# Patient Record
Sex: Female | Born: 1960
Health system: Southern US, Community
[De-identification: ages and names within clinical notes are randomized; demographics above are authoritative.]

## PROBLEM LIST (undated history)

## (undated) DIAGNOSIS — Z780 Asymptomatic menopausal state: Secondary | ICD-10-CM

## (undated) DIAGNOSIS — N83209 Unspecified ovarian cyst, unspecified side: Secondary | ICD-10-CM

## (undated) DIAGNOSIS — L920 Granuloma annulare: Secondary | ICD-10-CM

## (undated) DIAGNOSIS — S52501A Unspecified fracture of the lower end of right radius, initial encounter for closed fracture: Secondary | ICD-10-CM

## (undated) DIAGNOSIS — R0989 Other specified symptoms and signs involving the circulatory and respiratory systems: Secondary | ICD-10-CM

## (undated) DIAGNOSIS — H9190 Unspecified hearing loss, unspecified ear: Secondary | ICD-10-CM

## (undated) DIAGNOSIS — M549 Dorsalgia, unspecified: Secondary | ICD-10-CM

## (undated) DIAGNOSIS — F32A Depression, unspecified: Secondary | ICD-10-CM

## (undated) DIAGNOSIS — H04129 Dry eye syndrome of unspecified lacrimal gland: Secondary | ICD-10-CM

## (undated) HISTORY — DX: Dry eye syndrome of unspecified lacrimal gland: H04.129

## (undated) HISTORY — DX: Granuloma annulare: L92.0

## (undated) HISTORY — PX: APPENDECTOMY: SHX54

## (undated) HISTORY — DX: Other specified symptoms and signs involving the circulatory and respiratory systems: R09.89

## (undated) HISTORY — DX: Dorsalgia, unspecified: M54.9

## (undated) HISTORY — PX: ENDOMETRIAL ABLATION: SHX621

## (undated) HISTORY — DX: Unspecified fracture of the lower end of right radius, initial encounter for closed fracture: S52.501A

## (undated) HISTORY — DX: Unspecified hearing loss, unspecified ear: H91.90

## (undated) HISTORY — DX: Unspecified ovarian cyst, unspecified side: N83.209

## (undated) HISTORY — PX: BREAST BIOPSY: SHX20

## (undated) HISTORY — DX: Depression, unspecified: F32.A

## (undated) HISTORY — DX: Asymptomatic menopausal state: Z78.0

## (undated) HISTORY — PX: TONSILLECTOMY: SUR1361

---

## 1984-05-05 HISTORY — PX: OTHER SURGICAL HISTORY: SHX169

## 1998-10-05 ENCOUNTER — Ambulatory Visit (HOSPITAL_COMMUNITY): Admission: RE | Admit: 1998-10-05 | Discharge: 1998-10-05 | Payer: Self-pay | Admitting: *Deleted

## 2000-06-06 ENCOUNTER — Emergency Department (HOSPITAL_COMMUNITY): Admission: EM | Admit: 2000-06-06 | Discharge: 2000-06-06 | Payer: Self-pay | Admitting: Emergency Medicine

## 2000-06-06 ENCOUNTER — Encounter: Payer: Self-pay | Admitting: Emergency Medicine

## 2000-06-09 ENCOUNTER — Emergency Department (HOSPITAL_COMMUNITY): Admission: EM | Admit: 2000-06-09 | Discharge: 2000-06-09 | Payer: Self-pay | Admitting: Emergency Medicine

## 2000-07-27 ENCOUNTER — Other Ambulatory Visit: Admission: RE | Admit: 2000-07-27 | Discharge: 2000-07-27 | Payer: Self-pay | Admitting: Obstetrics and Gynecology

## 2001-09-03 ENCOUNTER — Other Ambulatory Visit: Admission: RE | Admit: 2001-09-03 | Discharge: 2001-09-03 | Payer: Self-pay | Admitting: Obstetrics and Gynecology

## 2002-10-07 ENCOUNTER — Encounter: Admission: RE | Admit: 2002-10-07 | Discharge: 2002-10-07 | Payer: Self-pay | Admitting: Internal Medicine

## 2002-10-07 ENCOUNTER — Encounter: Payer: Self-pay | Admitting: Internal Medicine

## 2002-11-21 ENCOUNTER — Other Ambulatory Visit: Admission: RE | Admit: 2002-11-21 | Discharge: 2002-11-21 | Payer: Self-pay | Admitting: Obstetrics and Gynecology

## 2003-12-05 ENCOUNTER — Other Ambulatory Visit: Admission: RE | Admit: 2003-12-05 | Discharge: 2003-12-05 | Payer: Self-pay | Admitting: Obstetrics and Gynecology

## 2004-03-07 ENCOUNTER — Ambulatory Visit: Payer: Self-pay | Admitting: Family Medicine

## 2004-03-07 ENCOUNTER — Encounter: Admission: RE | Admit: 2004-03-07 | Discharge: 2004-03-07 | Payer: Self-pay | Admitting: Family Medicine

## 2004-08-21 ENCOUNTER — Ambulatory Visit: Payer: Self-pay | Admitting: Family Medicine

## 2004-08-22 ENCOUNTER — Ambulatory Visit: Payer: Self-pay | Admitting: Internal Medicine

## 2004-08-28 ENCOUNTER — Ambulatory Visit: Payer: Self-pay | Admitting: Internal Medicine

## 2005-02-25 ENCOUNTER — Other Ambulatory Visit: Admission: RE | Admit: 2005-02-25 | Discharge: 2005-02-25 | Payer: Self-pay | Admitting: Obstetrics and Gynecology

## 2005-10-10 ENCOUNTER — Ambulatory Visit: Payer: Self-pay | Admitting: Internal Medicine

## 2005-11-24 ENCOUNTER — Ambulatory Visit: Payer: Self-pay | Admitting: Internal Medicine

## 2006-10-20 ENCOUNTER — Ambulatory Visit: Payer: Self-pay | Admitting: Family Medicine

## 2006-10-20 DIAGNOSIS — R1032 Left lower quadrant pain: Secondary | ICD-10-CM | POA: Insufficient documentation

## 2006-10-20 DIAGNOSIS — H9190 Unspecified hearing loss, unspecified ear: Secondary | ICD-10-CM | POA: Insufficient documentation

## 2006-10-21 ENCOUNTER — Ambulatory Visit (HOSPITAL_COMMUNITY): Admission: RE | Admit: 2006-10-21 | Discharge: 2006-10-21 | Payer: Self-pay | Admitting: Family Medicine

## 2006-10-22 ENCOUNTER — Telehealth: Payer: Self-pay | Admitting: Family Medicine

## 2006-10-28 ENCOUNTER — Ambulatory Visit: Payer: Self-pay | Admitting: Internal Medicine

## 2009-01-29 ENCOUNTER — Ambulatory Visit: Payer: Self-pay | Admitting: Internal Medicine

## 2009-05-05 HISTORY — PX: UPPER GASTROINTESTINAL ENDOSCOPY: SHX188

## 2009-09-25 ENCOUNTER — Ambulatory Visit: Payer: Self-pay | Admitting: Internal Medicine

## 2009-09-26 ENCOUNTER — Ambulatory Visit: Payer: Self-pay | Admitting: Internal Medicine

## 2009-09-26 ENCOUNTER — Encounter (INDEPENDENT_AMBULATORY_CARE_PROVIDER_SITE_OTHER): Payer: Self-pay | Admitting: *Deleted

## 2009-09-28 LAB — CONVERTED CEMR LAB
ALT: 20 units/L (ref 0–35)
AST: 20 units/L (ref 0–37)
BUN: 14 mg/dL (ref 6–23)
Basophils Absolute: 0 10*3/uL (ref 0.0–0.1)
Basophils Relative: 0.8 % (ref 0.0–3.0)
CO2: 27 meq/L (ref 19–32)
Calcium: 9.2 mg/dL (ref 8.4–10.5)
Chloride: 103 meq/L (ref 96–112)
Cholesterol: 243 mg/dL — ABNORMAL HIGH (ref 0–200)
Creatinine, Ser: 0.7 mg/dL (ref 0.4–1.2)
Direct LDL: 122.8 mg/dL
Eosinophils Absolute: 0.1 10*3/uL (ref 0.0–0.7)
Eosinophils Relative: 1.2 % (ref 0.0–5.0)
GFR calc non Af Amer: 100.21 mL/min (ref >60)
Glucose, Bld: 88 mg/dL (ref 70–99)
HCT: 40.7 % (ref 36.0–46.0)
HDL: 91.6 mg/dL (ref >39.00)
Hemoglobin: 14.1 g/dL (ref 12.0–15.0)
Lymphocytes Relative: 32.4 % (ref 12.0–46.0)
Lymphs Abs: 1.9 10*3/uL (ref 0.7–4.0)
MCHC: 34.6 g/dL (ref 30.0–36.0)
MCV: 99.7 fL (ref 78.0–100.0)
Monocytes Absolute: 0.4 10*3/uL (ref 0.1–1.0)
Monocytes Relative: 7 % (ref 3.0–12.0)
Neutro Abs: 3.4 10*3/uL (ref 1.4–7.7)
Neutrophils Relative %: 58.6 % (ref 43.0–77.0)
Platelets: 202 10*3/uL (ref 150.0–400.0)
Potassium: 4.5 meq/L (ref 3.5–5.1)
RBC: 4.09 M/uL (ref 3.87–5.11)
RDW: 12.8 % (ref 11.5–14.6)
Sodium: 140 meq/L (ref 135–145)
TSH: 2.87 microintl units/mL (ref 0.35–5.50)
Total CHOL/HDL Ratio: 3
Triglycerides: 166 mg/dL — ABNORMAL HIGH (ref 0.0–149.0)
VLDL: 33.2 mg/dL (ref 0.0–40.0)
WBC: 5.9 10*3/uL (ref 4.5–10.5)

## 2009-10-05 ENCOUNTER — Encounter (INDEPENDENT_AMBULATORY_CARE_PROVIDER_SITE_OTHER): Payer: Self-pay

## 2009-10-09 ENCOUNTER — Ambulatory Visit: Payer: Self-pay | Admitting: Gastroenterology

## 2009-10-22 ENCOUNTER — Ambulatory Visit: Payer: Self-pay | Admitting: Gastroenterology

## 2009-10-22 LAB — HM COLONOSCOPY

## 2009-12-19 ENCOUNTER — Telehealth (INDEPENDENT_AMBULATORY_CARE_PROVIDER_SITE_OTHER): Payer: Self-pay | Admitting: *Deleted

## 2009-12-19 ENCOUNTER — Ambulatory Visit: Payer: Self-pay | Admitting: Internal Medicine

## 2009-12-19 LAB — CONVERTED CEMR LAB
Bilirubin Urine: NEGATIVE
Glucose, Urine, Semiquant: NEGATIVE
Ketones, urine, test strip: NEGATIVE
Nitrite: NEGATIVE
Protein, U semiquant: NEGATIVE
Specific Gravity, Urine: 1.015
Urobilinogen, UA: 0.2
WBC Urine, dipstick: NEGATIVE
pH: 7

## 2009-12-20 ENCOUNTER — Encounter: Payer: Self-pay | Admitting: Internal Medicine

## 2009-12-20 LAB — CONVERTED CEMR LAB
Bacteria, UA: NONE SEEN
Casts: NONE SEEN /lpf
Crystals: NONE SEEN

## 2009-12-26 ENCOUNTER — Telehealth (INDEPENDENT_AMBULATORY_CARE_PROVIDER_SITE_OTHER): Payer: Self-pay | Admitting: *Deleted

## 2010-02-01 ENCOUNTER — Ambulatory Visit: Payer: Self-pay | Admitting: Internal Medicine

## 2010-02-01 ENCOUNTER — Ambulatory Visit: Payer: Self-pay | Admitting: Cardiology

## 2010-02-01 DIAGNOSIS — R319 Hematuria, unspecified: Secondary | ICD-10-CM | POA: Insufficient documentation

## 2010-02-01 LAB — CONVERTED CEMR LAB
ALT: 17 units/L (ref 0–35)
AST: 18 units/L (ref 0–37)
Albumin: 4 g/dL (ref 3.5–5.2)
Alkaline Phosphatase: 32 units/L — ABNORMAL LOW (ref 39–117)
Amylase: 100 units/L (ref 27–131)
BUN: 14 mg/dL (ref 6–23)
Basophils Absolute: 0 10*3/uL (ref 0.0–0.1)
Basophils Relative: 0.6 % (ref 0.0–3.0)
Bilirubin Urine: NEGATIVE
Bilirubin, Direct: 0.1 mg/dL (ref 0.0–0.3)
CO2: 28 meq/L (ref 19–32)
Calcium: 9 mg/dL (ref 8.4–10.5)
Chloride: 101 meq/L (ref 96–112)
Creatinine, Ser: 0.8 mg/dL (ref 0.4–1.2)
Eosinophils Absolute: 0.1 10*3/uL (ref 0.0–0.7)
Eosinophils Relative: 0.9 % (ref 0.0–5.0)
GFR calc non Af Amer: 80.15 mL/min (ref >60)
Glucose, Bld: 87 mg/dL (ref 70–99)
Glucose, Urine, Semiquant: NEGATIVE
HCT: 41 % (ref 36.0–46.0)
Hemoglobin: 14.1 g/dL (ref 12.0–15.0)
Ketones, urine, test strip: NEGATIVE
Lipase: 31 units/L (ref 11.0–59.0)
Lymphocytes Relative: 35 % (ref 12.0–46.0)
Lymphs Abs: 2.2 10*3/uL (ref 0.7–4.0)
MCHC: 34.3 g/dL (ref 30.0–36.0)
MCV: 100.5 fL — ABNORMAL HIGH (ref 78.0–100.0)
Monocytes Absolute: 0.5 10*3/uL (ref 0.1–1.0)
Monocytes Relative: 7.5 % (ref 3.0–12.0)
Neutro Abs: 3.5 10*3/uL (ref 1.4–7.7)
Neutrophils Relative %: 56 % (ref 43.0–77.0)
Nitrite: NEGATIVE
Platelets: 213 10*3/uL (ref 150.0–400.0)
Potassium: 5.2 meq/L — ABNORMAL HIGH (ref 3.5–5.1)
Protein, U semiquant: NEGATIVE
RBC: 4.08 M/uL (ref 3.87–5.11)
RDW: 12.9 % (ref 11.5–14.6)
Sodium: 136 meq/L (ref 135–145)
Specific Gravity, Urine: 1.015
Total Bilirubin: 0.3 mg/dL (ref 0.3–1.2)
Total Protein: 6.7 g/dL (ref 6.0–8.3)
Urobilinogen, UA: 0.2
WBC Urine, dipstick: NEGATIVE
WBC: 6.3 10*3/uL (ref 4.5–10.5)
pH: 6

## 2010-02-02 ENCOUNTER — Encounter: Payer: Self-pay | Admitting: Internal Medicine

## 2010-02-03 LAB — CONVERTED CEMR LAB
Casts: NONE SEEN /lpf
Crystals: NONE SEEN

## 2010-02-04 ENCOUNTER — Encounter: Payer: Self-pay | Admitting: Gastroenterology

## 2010-02-04 ENCOUNTER — Telehealth: Payer: Self-pay | Admitting: Internal Medicine

## 2010-02-22 ENCOUNTER — Telehealth: Payer: Self-pay | Admitting: Internal Medicine

## 2010-02-22 ENCOUNTER — Telehealth: Payer: Self-pay | Admitting: Gastroenterology

## 2010-02-25 ENCOUNTER — Ambulatory Visit: Payer: Self-pay | Admitting: Gastroenterology

## 2010-02-25 ENCOUNTER — Encounter: Payer: Self-pay | Admitting: Gastroenterology

## 2010-02-25 DIAGNOSIS — R0989 Other specified symptoms and signs involving the circulatory and respiratory systems: Secondary | ICD-10-CM

## 2010-02-25 DIAGNOSIS — R1084 Generalized abdominal pain: Secondary | ICD-10-CM | POA: Insufficient documentation

## 2010-02-25 HISTORY — DX: Other specified symptoms and signs involving the circulatory and respiratory systems: R09.89

## 2010-03-01 ENCOUNTER — Ambulatory Visit: Payer: Self-pay | Admitting: Gastroenterology

## 2010-04-12 ENCOUNTER — Ambulatory Visit: Payer: Self-pay | Admitting: Gastroenterology

## 2010-06-02 LAB — CONVERTED CEMR LAB
ALT: 13 units/L (ref 0–40)
AST: 16 units/L (ref 0–37)
Albumin: 4.2 g/dL (ref 3.5–5.2)
Alkaline Phosphatase: 31 units/L — ABNORMAL LOW (ref 39–117)
BUN: 8 mg/dL (ref 6–23)
Basophils Absolute: 0 10*3/uL (ref 0.0–0.1)
Basophils Relative: 0.3 % (ref 0.0–1.0)
Bilirubin Urine: NEGATIVE
Bilirubin, Direct: 0.1 mg/dL (ref 0.0–0.3)
CO2: 31 meq/L (ref 19–32)
Calcium: 9.1 mg/dL (ref 8.4–10.5)
Chloride: 103 meq/L (ref 96–112)
Creatinine, Ser: 0.7 mg/dL (ref 0.4–1.2)
Eosinophils Absolute: 0.1 10*3/uL (ref 0.0–0.6)
Eosinophils Relative: 0.9 % (ref 0.0–5.0)
GFR calc Af Amer: 115 mL/min
GFR calc non Af Amer: 95 mL/min
Glucose, Bld: 88 mg/dL (ref 70–99)
Glucose, Urine, Semiquant: NEGATIVE
HCT: 40.6 % (ref 36.0–46.0)
Hemoglobin: 14.1 g/dL (ref 12.0–15.0)
Ketones, urine, test strip: NEGATIVE
Lymphocytes Relative: 32.8 % (ref 12.0–46.0)
MCHC: 34.7 g/dL (ref 30.0–36.0)
MCV: 96.6 fL (ref 78.0–100.0)
Monocytes Absolute: 0.5 10*3/uL (ref 0.2–0.7)
Monocytes Relative: 8.3 % (ref 3.0–11.0)
Neutro Abs: 3.6 10*3/uL (ref 1.4–7.7)
Neutrophils Relative %: 57.7 % (ref 43.0–77.0)
Nitrite: NEGATIVE
Platelets: 201 10*3/uL (ref 150–400)
Potassium: 3.6 meq/L (ref 3.5–5.1)
Protein, U semiquant: NEGATIVE
RBC: 4.2 M/uL (ref 3.87–5.11)
RDW: 12.2 % (ref 11.5–14.6)
Sodium: 139 meq/L (ref 135–145)
Specific Gravity, Urine: 1.015
TSH: 2.27 microintl units/mL (ref 0.35–5.50)
Total Bilirubin: 0.6 mg/dL (ref 0.3–1.2)
Total Protein: 7.1 g/dL (ref 6.0–8.3)
Urobilinogen, UA: NEGATIVE
WBC Urine, dipstick: NEGATIVE
WBC: 6.2 10*3/uL (ref 4.5–10.5)
pH: 6

## 2010-06-04 NOTE — Procedures (Signed)
Summary: Upper Endoscopy  Patient: [name] Note: All result statuses are Final unless otherwise noted.  Tests: (1) Upper Endoscopy (EGD)   EGD Upper Endoscopy       DONE     [facility]     [address]     [place], [place], [place]  [address]           ENDOSCOPY PROCEDURE REPORT           PATIENT:  Pamela, Hansen  MR#:  [id number]     BIRTHDATE:  Oct 15, 1960, 49 yrs. old  GENDER:  female     ENDOSCOPIST:  Rachael Fee, MD     PROCEDURE DATE:  03/01/2010     PROCEDURE:  EGD with biopsy, 43239     ASA CLASS:  Class II     INDICATIONS:  post prandial left sided abdominal pain; normal     cmet, cbc; Ct scan suggested Celiac Axis stenosis, post stenotic     dilation     MEDICATIONS:  Fentanyl 50 mcg IV, Versed 3 mg IV     TOPICAL ANESTHETIC:  Exactacain Spray           DESCRIPTION OF PROCEDURE:   After the risks benefits and     alternatives of the procedure were thoroughly explained, informed     consent was obtained.  The LB GIF-H180 [id number] endoscope was     introduced through the mouth and advanced to the second portion of     the duodenum, without limitations.  The instrument was slowly     withdrawn as the mucosa was fully examined.     <<PROCEDUREIMAGES>>     There was mild to moderate, non-specific gastritis. Most prominant     in distal stomach. Biopsies taken to check for H. pylori (see     image5).  Otherwise the examination was normal (see image6,     image4, image3, and image1).    Retroflexed views revealed no     abnormalities.    The scope was then withdrawn from the patient     and the procedure completed.     COMPLICATIONS:  None           ENDOSCOPIC IMPRESSION:     1) Mild to moderate non-specific gastritis, biopsied to check     for H. pylori     2) Otherwise normal examination           RECOMMENDATIONS:     Await final biopsies.  If H. pylori is detected, she will be     started on the appropriate antibiotics.     In the meantime, please  start once daily OTC prilosec (or     generic equivalent). This is best taken 20-30 min prior to BF     meal.           ______________________________     Rachael Fee, MD           n.     eSIGNED:   Rachael Fee at 03/01/2010 10:31 AM           Mcclay, Clydie Braun, [id number]  Note: An exclamation mark (!) indicates a result that was not dispersed into the flowsheet. Document Creation Date: 03/01/2010 10:32 AM _______________________________________________________________________  (1) Order result status: Final Collection or observation date-time: 03/01/2010 10:24 Requested date-time:  Receipt date-time:  Reported date-time:  Referring Physician:   Ordering Physician: Rob Bunting [phone]) Specimen Source:  Source: Launa Grill Order Number:  [id number] Lab site:

## 2010-06-04 NOTE — Progress Notes (Signed)
Summary: FYI on ABX  Phone Note Call from Patient Call back at Work Phone [phone]   Caller: Patient Summary of Call: Patient called and said that she is on her Cipro that Dr. Drue Novel rx'd her, but she got a call from her GYN office saying that she had a bacterial infection and they were calling in something for her.   I called and spoke with the pharmacist @ [facility] and the [facility] office called in Flagyl which is compatible to take with the Cipro.  Initial call taken by: Harold Barban,  December 19, 2009 11:48 AM  Follow-up for Phone Call        Spoke with patient and informed her she could take both because they treat different things.  Follow-up by: Harold Barban,  December 19, 2009 11:48 AM  Additional Follow-up for Phone Call Additional follow up Details #1::        okay to take both for now, we'll see how the urine culture come back and how she's feeling in  few days Additional Follow-up by: [facility] E. Paz MD,  December 19, 2009 4:55 PM

## 2010-06-04 NOTE — Letter (Signed)
Summary: EGD Instructions  [facility]  [address] [place], [place] [address]   Phone: [phone]  Fax: [phone]       Pamela Hansen    Jun 28, 1960    MRN: [id number]       Procedure Day /Date: 03-01-10     Arrival Time: 9:00 AM      Procedure Time:10:00 AM     Location of Procedure:                    X     [facility] (4th Floor) PREPARATION FOR ENDOSCOPY   On 03-01-10 THE DAY OF THE PROCEDURE:  1.   No solid foods, milk or milk products are allowed after midnight the night before your procedure.  2.   Do not drink anything colored red or purple.  Avoid juices with pulp.  No orange juice.  3.  You may drink clear liquids until 8:00 Am , which is 2 hours before your procedure.                                                                                                CLEAR LIQUIDS INCLUDE: Water Jello Ice Popsicles Tea (sugar ok, no milk/cream) Powdered fruit flavored drinks Coffee (sugar ok, no milk/cream) Gatorade Juice: apple, white grape, white cranberry  Lemonade Clear bullion, consomm, broth Carbonated beverages (any kind) Strained chicken noodle soup Hard Candy   MEDICATION INSTRUCTIONS  Unless otherwise instructed, you should take regular prescription medications with a small sip of water as early as possible the morning of your procedure.          OTHER INSTRUCTIONS  You will need a responsible adult at least 52 years of age to accompany you and drive you home.   This person must remain in the waiting room during your procedure.  Wear loose fitting clothing that is easily removed.  Leave jewelry and other valuables at home.  However, you may wish to bring a book to read or an iPod/MP3 player to listen to music as you wait for your procedure to start.  Remove all body piercing jewelry and leave at home.  Total time from sign-in until discharge is approximately 2-3 hours.  You should go home directly after  your procedure and rest.  You can resume normal activities the day after your procedure.  The day of your procedure you should not:   Drive   Make legal decisions   Operate machinery   Drink alcohol   Return to work  You will receive specific instructions about eating, activities and medications before you leave.    The above instructions have been reviewed and explained to me by   _______________________    I fully understand and can verbalize these instructions _____________________________ Date _________
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
## 2010-06-04 NOTE — Procedures (Signed)
Summary: Colonoscopy  Patient: Pamela Hansen Note: All result statuses are Final unless otherwise noted.  Tests: (1) Colonoscopy (COL)   COL Colonoscopy           DONE     [facility]     [address]     [place], [place]  [address]           COLONOSCOPY PROCEDURE REPORT           PATIENT:  [name], [name]  MR#:  [id number]     BIRTHDATE:  1961/01/26, 49 yrs. old  GENDER:  female     ENDOSCOPIST:  Rachael Fee, MD     REF. BY:  Willow Ora, M.D.     PROCEDURE DATE:  10/22/2009     PROCEDURE:  Diagnostic Colonoscopy     ASA CLASS:  Class II     INDICATIONS:  Routine Risk Screening     MEDICATIONS:   Fentanyl 100 mcg IV, Versed 10 mg IV           DESCRIPTION OF PROCEDURE:   After the risks benefits and     alternatives of the procedure were thoroughly explained, informed     consent was obtained.  Digital rectal exam was performed and     revealed no rectal masses.   The LB PCF-H180AL [id number] endoscope     was introduced through the anus and advanced to the cecum, which     was identified by both the appendix and ileocecal valve, without     limitations.  The quality of the prep was good, using MoviPrep.     The instrument was then slowly withdrawn as the colon was fully     examined.     <<PROCEDUREIMAGES>>           FINDINGS:  A normal appearing cecum, ileocecal valve, and     appendiceal orifice were identified. The ascending, hepatic     flexure, transverse, splenic flexure, descending, sigmoid colon,     and rectum appeared unremarkable (see image1, image2, and image3).     Internal and external hemorrhoids were found. These were small and     not thrombosed.   Retroflexed views in the rectum revealed no     abnormalities.    The scope was then withdrawn from the patient     and the procedure completed.           COMPLICATIONS:  None           ENDOSCOPIC IMPRESSION:     1) Normal colon     2) Internal and external hemorrhoids     3) No polyps or  cancers           RECOMMENDATIONS:     1) Continue to follow current colorectal screening     recommendations for "routine risk" patients with a repeat     colonoscopy in 10 years.           REPEAT EXAM:  10 years           ______________________________     Rachael Fee, MD           n.     eSIGNED:   Rachael Fee at 10/22/2009 11:21 AM           Minta Balsam, Clydie Braun, [id number]  Note: An exclamation mark (!) indicates a result that was not dispersed into the flowsheet. Document Creation Date: 10/22/2009 11:22 AM _______________________________________________________________________  (1) Order  result status: Final Collection or observation date-time: 10/22/2009 11:17 Requested date-time:  Receipt date-time:  Reported date-time:  Referring Physician:   Ordering Physician: Rob Bunting [phone]) Specimen Source:  Source: Launa Grill Order Number: [id number] Lab site:   Appended Document: Colonoscopy    Clinical Lists Changes  Observations: Added new observation of COLONNXTDUE: 10/2019 (10/22/2009 14:09)

## 2010-06-04 NOTE — Letter (Signed)
Summary: Primary Care Consult Scheduled Letter  [facility]  [address] [place], [place] [address]   Phone: [phone]  Fax: [phone]      09/26/2009 MRN: [id number]  [facility] [address] [place], [place]  [address]    Dear Ms. Tomei,    We have scheduled an appointment for you.  At the recommendation of Dr. Willow Ora, we have scheduled you for a Screening Colonoscopy with Dr. Rob Bunting of [facility].  Your initial Pre-Visit with a Nurse is on 10-09-2009 at 10:30am.  Your Screening Colonoscopy is on 10-22-2009 arrive by 10:00am.  Their address is [address], [place] [address]. The office phone number is [phone].  If this appointment day and time is not convenient for you, please feel free to call the office of the doctor you are being referred to at the number listed above and reschedule the appointment.    It is important for you to keep your scheduled appointments. We are here to make sure you are given good patient care.   Thank you,    Renee, Patient Care Coordinator [facility]
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
## 2010-06-04 NOTE — Assessment & Plan Note (Signed)
Summary: warm feeling all over,pain left side,cbs   Vital Signs:  Patient Profile:   50 Years Old Female Weight:      136.4 pounds Temp:     98.3 degrees F oral Pulse rate:   72 / minute BP sitting:   102 / 64  (left arm)  Vitals Entered By: Shonna Chock (October 20, 2006 11:45 AM)               PCP:  lowne  Chief Complaint:  PAIN LEFT LOWER ABDOMINAL AREA X 2WEEKS AND A WARM FEELING ALL OVER.Marland Kitchen  History of Present Illness: Pt c/o few week hx of LLQ abd pain on and off.  Pt also c/o palpatations last night.  Lasted 15 min then gone.  No chest pain , no sob.   No n/V/D no fever.  No otc meds.  Current Allergies: ! CLARITIN (LORATADINE TABS) ! ZYRTEC (CETIRIZINE HCL TABS)  Past Surgical History:    Appendectomy    Tonsillectomy     Review of Systems      See HPI  General      Denies chills, fatigue, fever, loss of appetite, malaise, sleep disorder, sweats, weakness, and weight loss.  ENT      Denies decreased hearing, difficulty swallowing, ear discharge, earache, hoarseness, nasal congestion, nosebleeds, postnasal drainage, ringing in ears, sinus pressure, and sore throat.  CV      Complains of palpitations.      Denies chest pain or discomfort, difficulty breathing at night, difficulty breathing while lying down, lightheadness, and near fainting.  Resp      Denies chest discomfort, chest pain with inspiration, cough, coughing up blood, excessive snoring, hypersomnolence, morning headaches, pleuritic, shortness of breath, sputum productive, and wheezing.  GI      Complains of abdominal pain and excessive appetite.      Denies bloody stools, change in bowel habits, constipation, dark tarry stools, diarrhea, gas, indigestion, loss of appetite, nausea, and vomiting.  GU      Denies abnormal vaginal bleeding, decreased libido, discharge, dysuria, genital sores, hematuria, incontinence, nocturia, urinary frequency, and urinary hesitancy.   Physical Exam  General:     Well-developed,well-nourished,in no acute distress; alert,appropriate and cooperative throughout examination Eyes:     vision grossly intact, pupils round, and pupils reactive to light.   Lungs:     Normal respiratory effort, chest expands symmetrically. Lungs are clear to auscultation, no crackles or wheezes. Heart:     normal rate, regular rhythm, and no murmur.   Abdomen:     minimal am. of tenderness LLQsoft, no distention, no guarding, no rigidity, and no rebound tenderness.   Rectal:     No external abnormalities noted. Normal sphincter tone. No rectal masses or tenderness.external hemorrhoid(s).  Heme negative brown stool Genitalia:     Normal introitus for age, no external lesions, no vaginal discharge, mucosa pink and moist, no vaginal or cervical lesions, no vaginal atrophy, no friaility or hemorrhage, normal uterus size and position, no adnexal masses or tenderness Neurologic:     alert & oriented X3 and cranial nerves II-XII intact. Pt is hard of hearing--- Hx of      Impression & Recommendations:  Problem # 1:  SYMPTOM, PAIN, ABDOMINAL, LEFT LOWER QUADRANT (ICD-789.04)  Orders: Venipuncture (42595) TLB-BMP (Basic Metabolic Panel-BMET) (80048-METABOL) TLB-Hepatic/Liver Function Pnl (80076-HEPATIC) TLB-CBC Platelet - w/Differential (85025-CBCD) TLB-TSH (Thyroid Stimulating Hormone) (63875-IEP) Radiology Referral (Radiology) [place] abd levsin SL  nexium 40 mg once daily   Orders: Venipuncture (  54098) TLB-BMP (Basic Metabolic Panel-BMET) (80048-METABOL) TLB-Hepatic/Liver Function Pnl (80076-HEPATIC) TLB-CBC Platelet - w/Differential (85025-CBCD) TLB-TSH (Thyroid Stimulating Hormone) (11914-NWG) Radiology Referral (Radiology)   Problem # 2:  PALPITATIONS (ICD-785.1) RTO if they occur again--- consider Event monitor vs echo Orders: Venipuncture (95621) TLB-BMP (Basic Metabolic Panel-BMET) (80048-METABOL) TLB-Hepatic/Liver Function Pnl (80076-HEPATIC) TLB-CBC  Platelet - w/Differential (85025-CBCD) TLB-TSH (Thyroid Stimulating Hormone) (84443-TSH) EKG w/ Interpretation (93000)   Medications Added to Medication List This Visit: 1)  Flonase 50 Mcg/act Susp (Fluticasone propionate) .... As needed 2)  Levsin/sl 0.125 Mg Subl (Hyoscyamine sulfate) .Marland Kitchen.. 1 by mouth under tongue every 4 hours as needed abd. cramping   Laboratory Results   Urine Tests   Date/Time Reported: October 20, 2006 12:06 PM   Routine Urinalysis   Color: lt. yellow Appearance: Clear Glucose: negative   (Normal Range: Negative) Bilirubin: negative   (Normal Range: Negative) Ketone: negative   (Normal Range: Negative) Spec. Gravity: 1.015   (Normal Range: 1.003-1.035) Blood: small   (Normal Range: Negative) pH: 6.0   (Normal Range: 5.0-8.0) Protein: negative   (Normal Range: Negative) Urobilinogen: negative   (Normal Range: 0-1) Nitrite: negative   (Normal Range: Negative) Leukocyte Esterace: negative   (Normal Range: Negative)

## 2010-06-04 NOTE — Progress Notes (Signed)
Summary: results/referral  Phone Note Call from Patient Call back at Home Phone [phone]   Summary of Call: Patient calling for test results, and would like to know about specialist. Please advise.   Please leave detailed message if she does not answer. Initial call taken by: Lucious Groves CMA,  February 04, 2010 11:35 AM  Follow-up for Phone Call        left message, told patient we are refering  her to GI for evaluation of abdominal pain and abnormal CT Follow-up by: [facility] E. Trystyn Dolley MD,  February 05, 2010 12:50 PM

## 2010-06-04 NOTE — Progress Notes (Signed)
Summary: LOWNE,Y-TEST RESULTS  Phone Note Call from Patient Call back at [phone]   Caller: Patient Reason for Call: Lab or Test Results Summary of Call: PLEASE CALL RESULTS IN TO HER AT WORK NUMBER LISTED ABOVE  Initial call taken by: Gwen Pounds,  October 22, 2006 12:57 PM  Follow-up for Phone Call        Informed pt of results says pain still comes and goes depending on what she is doing.  I also let her know that a CT may be ordered so she is waiting for further recommendations.  Pt is going ahead with  CT referral Follow-up by: Doristine Devoid,  October 22, 2006 2:55 PM  Additional Follow-up for Phone Call Additional follow up Details #1::        ordered Additional Follow-up by: Loreen Freud DO,  October 22, 2006 5:38 PM

## 2010-06-04 NOTE — Progress Notes (Signed)
Summary: stomach pain  Phone Note Call from Patient Call back at Work Phone [phone]   Caller: Patient Summary of Call: Pt called Dr.Jacobs office and they transferred her to our office. She states the pain in her stomach is getting worse. She states they are worse after she eats. Pt would like to know if there is something she should be doing or is it okay for her to wait until November 15th to see Dr.Jacobs.  Please advise.  Initial call taken by: Army Fossa CMA,  February 22, 2010 1:25 PM  Follow-up for Phone Call        please call GI, see if they could see her sooner [facility] E. Jesilyn Easom MD  February 22, 2010 1:33 PM   Additional Follow-up for Phone Call Additional follow up Details #1::        Spoke with Dr. Christella Hartigan office they are going to discuss with Dr.Jacobs who will be back in the office and Monday. They will notify the pt of new appt. Pt is aware. Additional Follow-up by: Army Fossa CMA,  February 22, 2010 1:48 PM

## 2010-06-04 NOTE — Assessment & Plan Note (Signed)
  Review of gastrointestinal problems: 1. Routine risk for colon cancer, colonoscopy 2011 found hemorrhoids but no polyps or cancers. Next colonoscopy at 10 year interval 2. abdominal discomfort, EGD October 2011 found mild to moderate gastritis, biopsies were negative for H. pylori.  Trial of proton pump inhibitors   .  CAT Scan September 2011 suggested abnormal celiac trunk with post stenotic dilation   History of Present Illness Visit Type: Follow-up Visit Primary GI MD: Rob Bunting MD Primary Provider: Willow Ora, MD Chief Complaint: 4-5 week f/u History of Present Illness:     very pleasant 50 year old woman whom I last saw about a month a half ago.  she took 14 days of prilosec, pains went away.  Stools also became a bit more solid during that time.  She drinks about 2 glasses of wine a day.  Also stoppe this at time of starting prilosec. Still feels better.  She thinks some spicey foods may contribute.             Current Medications (verified): 1)  Restasis 0.05 % Emul (Cyclosporine) 2)  Fish Oil 1000 Mg Caps (Omega-3 Fatty Acids) .... One Capsule By Mouth Once Daily  Allergies (verified): 1)  ! Claritin 2)  ! Zyrtec 3)  ! * Shellfish  Vital Signs:  Patient profile:   50 year old female Height:      62 inches Weight:      137 pounds BMI:     25.15 Pulse rate:   80 / minute Pulse rhythm:   regular BP sitting:   112 / 52  (left arm)  Vitals Entered By: Chales Abrahams CMA Duncan Dull) (April 12, 2010 10:55 AM)  Physical Exam  Additional Exam:  Constitutional: generally well appearing Psychiatric: alert and oriented times 3 Abdomen: soft, non-tender, non-distended, normal bowel sounds    Impression & Recommendations:  Problem # 1:  Gastritis, dyspepsia I think her symptoms are GERD, acid related. She will restart proton pump inhibitor once daily for the next month and then back down to every other day. She will return to see me on an as-needed basis. The celiac  axis narrowing noted by CT scan is probably not clinically significant however she does note to be where of significant, postprandial pains.  Patient Instructions: 1)  Restart omeprazole (prilosec), one pill a day (20-30 min before BF meal).  In about one month, cut back to every other day. 2)  Ok to have 1-2 glassed of wine a day. 3)  Return to Dr. Christella Hartigan as needed, especially if you have signficant pains after meals. 4)  A copy of this information will be sent to  Dr. Drue Novel. 5)  The medication list was reviewed and reconciled.  All changed / newly prescribed medications were explained.  A complete medication list was provided to the patient / caregiver.

## 2010-06-04 NOTE — Assessment & Plan Note (Signed)
Summary: FRACTURE ?/KDC   Vital Signs:  Patient profile:   50 year old female Height:      62 inches Weight:      139.4 pounds BMI:     25.59 Pulse rate:   70 / minute Pulse rhythm:   regular BP sitting:   90 / 60  (left arm) Cuff size:   regular  Vitals Entered By: Shary Decamp (January 29, 2009 8:31 AM) CC: acute only -- walk in Is Patient Diabetic? No Comments   - injured rt chin tubing Labor day weekend (hit it on a rock)   - + pain with walking, c/o of ankle swelling & bruising .....Marland KitchenMarland KitchenShary Decamp  January 29, 2009 8:34 AM    History of Present Illness:  acute only -- walk in hit R pretibial area 3 weeks ago, area was swollen, tender, "blue and red" swelling better still hurts a lot when walks  c/o of ankle swelling & bruising after walking   Current Medications (verified): 1)  None  Allergies (verified): 1)  ! Claritin 2)  ! Zyrtec  Past History:  Past Medical History: deafness  h/o dry eyes (on Restassis) h/o ovarian cysts  h/o granuloma annulary  Social History: married  2 daughters  tobacco--no ETOH-- wine socially   Review of Systems       no fever no calf pain or swelling   Physical Exam  General:  alert and well-developed.   Pulses:  normal pedal pulses bilaterally  Extremities:  L leg-ankle normal R leg: mod pretibial area w/some swelling, + tenderness to touch, no deformities-warmness R ankle: normal    Impression & Recommendations:  Problem # 1:  CONTUSION, LOWER LEG, RIGHT (ICD-924.10)  motrin and ice as needed call if no better in 3-4 weeks XR to asses tib-fib  Orders: T-Tib/Fib Right (73590TC)

## 2010-06-04 NOTE — Progress Notes (Signed)
Summary: checking on patient   Phone Note Outgoing Call   Summary of Call: advise patient: Urine culture was negative, she can discontinue Cipro. let me know if she still has symptoms Jose E. Paz MD  December 26, 2009 8:35 AM     Follow-up for Phone Call        Left message for pt to call back. Army Fossa CMA  December 26, 2009 10:16 AM  Patient called back and LM to call her back, lmtcb.Marland KitchenMarland KitchenHarold Barban  December 26, 2009 1:08 PM  Additional Follow-up for Phone Call Additional follow up Details #1::        Left message to call back. Army Fossa CMA  December 27, 2009 12:39 PM  spoke w/ patient aware of information .........Marland KitchenDoristine Devoid CMA  December 27, 2009 4:57 PM

## 2010-06-04 NOTE — Letter (Signed)
Summary: [facility] Instructions  [facility]  [address] [place], [place] [address]   Phone: [phone]  Fax: [phone]       Pamela Hansen    23-Mar-1975    MRN: [id number]        Procedure Day Dorna Bloom:  Duanne Limerick  10/22/09     Arrival Time:  10:00AM     Procedure Time:  11:00AM     Location of Procedure:                    _X _  [facility] (4th Floor)                        PREPARATION FOR COLONOSCOPY WITH MOVIPREP   Starting 5 days prior to your procedure 10/17/09 do not eat nuts, seeds, popcorn, corn, beans, peas,  salads, or any raw vegetables.  Do not take any fiber supplements (e.g. Metamucil, Citrucel, and Benefiber).  THE DAY BEFORE YOUR PROCEDURE         DATE: 10/21/09  DAY: SUNDAY  1.  Drink clear liquids the entire day-NO SOLID FOOD  2.  Do not drink anything colored red or purple.  Avoid juices with pulp.  No orange juice.  3.  Drink at least 64 oz. (8 glasses) of fluid/clear liquids during the day to prevent dehydration and help the prep work efficiently.  CLEAR LIQUIDS INCLUDE: Water Jello Ice Popsicles Tea (sugar ok, no milk/cream) Powdered fruit flavored drinks Coffee (sugar ok, no milk/cream) Gatorade Juice: apple, white grape, white cranberry  Lemonade Clear bullion, consomm, broth Carbonated beverages (any kind) Strained chicken noodle soup Hard Candy                             4.  In the morning, mix first dose of MoviPrep solution:    Empty 1 Pouch A and 1 Pouch B into the disposable container    Add lukewarm drinking water to the top line of the container. Mix to dissolve    Refrigerate (mixed solution should be used within 24 hrs)  5.  Begin drinking the prep at 5:00 p.m. The MoviPrep container is divided by 4 marks.   Every 15 minutes drink the solution down to the next mark (approximately 8 oz) until the full liter is complete.   6.  Follow completed prep with 16 oz of clear liquid of your choice  (Nothing red or purple).  Continue to drink clear liquids until bedtime.  7.  Before going to bed, mix second dose of MoviPrep solution:    Empty 1 Pouch A and 1 Pouch B into the disposable container    Add lukewarm drinking water to the top line of the container. Mix to dissolve    Refrigerate  THE DAY OF YOUR PROCEDURE      DATE: 10/22/09  DAY: MONDAY  Beginning at 6:00AM (5 hours before procedure):         1. Every 15 minutes, drink the solution down to the next mark (approx 8 oz) until the full liter is complete.  2. Follow completed prep with 16 oz. of clear liquid of your choice.    3. You may drink clear liquids until 9:00AM (2 HOURS BEFORE PROCEDURE).   MEDICATION INSTRUCTIONS  Unless otherwise instructed, you should take regular prescription medications with a small sip of water   as early as possible the morning  of your procedure.         OTHER INSTRUCTIONS  You will need a responsible adult at least 52 years of age to accompany you and drive you home.   This person must remain in the waiting room during your procedure.  Wear loose fitting clothing that is easily removed.  Leave jewelry and other valuables at home.  However, you may wish to bring a book to read or  an iPod/MP3 player to listen to music as you wait for your procedure to start.  Remove all body piercing jewelry and leave at home.  Total time from sign-in until discharge is approximately 2-3 hours.  You should go home directly after your procedure and rest.  You can resume normal activities the  day after your procedure.  The day of your procedure you should not:   Drive   Make legal decisions   Operate machinery   Drink alcohol   Return to work  You will receive specific instructions about eating, activities and medications before you leave.    The above instructions have been reviewed and explained to me by   Ulis Rias RN  October 09, 2009 10:48 AM     I fully understand and  can verbalize these instructions _____________________________ Date _________
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
## 2010-06-04 NOTE — Letter (Signed)
Summary: New Patient letter  [facility]  [address] [place], [place] [address]   Phone: [phone]  Fax: [phone]       02/04/2010 MRN: [id number]  [facility] [address] [place], [place]  [address]  Dear Ms. Bouley,  Welcome to the Gastroenterology Division at [facility].    You are scheduled to see Dr.  Christella Hartigan on 03-19-10 at 10:30am on the 3rd floor at [facility], [address]  We ask that you try to arrive at our office 15 minutes prior to your appointment time to allow for check-in.  We would like you to complete the enclosed self-administered evaluation form prior to your visit and bring it with you on the day of your appointment.  We will review it with you.  Also, please bring a complete list of all your medications or, if you prefer, bring the medication bottles and we will list them.  Please bring your insurance card so that we may make a copy of it.  If your insurance requires a referral to see a specialist, please bring your referral form from your primary care physician.  Co-payments are due at the time of your visit and may be paid by cash, check or credit card.     Your office visit will consist of a consult with your physician (includes a physical exam), any laboratory testing he/she may order, scheduling of any necessary diagnostic testing (e.g. x-ray, ultrasound, CT-scan), and scheduling of a procedure (e.g. Endoscopy, Colonoscopy) if required.  Please allow enough time on your schedule to allow for any/all of these possibilities.    If you cannot keep your appointment, please call [phone] to cancel or reschedule prior to your appointment date.  This allows [place] the opportunity to schedule an appointment for another patient in need of care.  If you do not cancel or reschedule by 5 p.m. the business day prior to your appointment date, you will be charged a $50.00 late cancellation/no-show fee.    Thank you for choosing  [facility] for your medical needs.  We appreciate the opportunity to care for you.  Please visit [place] at our website  to learn more about our practice.                     Sincerely,                                                             The Gastroenterology Division

## 2010-06-04 NOTE — Miscellaneous (Signed)
Summary: Lec previsit  Clinical Lists Changes  Medications: Added new medication of MOVIPREP 100 GM  SOLR (PEG-KCL-NACL-NASULF-NA ASC-C) As per prep instructions. - Signed Rx of MOVIPREP 100 GM  SOLR (PEG-KCL-NACL-NASULF-NA ASC-C) As per prep instructions.;  #1 x 0;  Signed;  Entered by: Ulis Rias RN;  Authorized by: Rachael Fee MD;  Method used: Electronically to [id number] [address] [place], [place], [place]  [address], Ph: [phone], Fax: [phone] Observations: Added new observation of ALLERGY REV: Done (10/09/2009 10:12)    Prescriptions: MOVIPREP 100 GM  SOLR (PEG-KCL-NACL-NASULF-NA ASC-C) As per prep instructions.  #1 x 0   Entered by:   Ulis Rias RN   Authorized by:   Rachael Fee MD   Signed by:   Ulis Rias RN on 10/09/2009   Method used:   Electronically to        [id number] (retail)       [address]       [place], [place]  [address]       Ph: [phone]       Fax: [phone]   RxID:   [id number]

## 2010-06-04 NOTE — Progress Notes (Signed)
Summary: Appt sooner  Phone Note Call from Patient Call back at Work Phone [phone]   Call For: Dr Christella Hartigan Summary of Call: Abd Pain is getting worse & would like to be seen sooner than 03-19-10. Initial call taken by: Leanor Kail [facility],  February 22, 2010 1:47 PM  Follow-up for Phone Call        pt has continued constant left side abd pain, does not want to wait to see Dr Christella Hartigan until 11/15.  Appt scheduled with Gunnar Fusi for 02/25/10 Follow-up by: Chales Abrahams CMA Duncan Dull),  February 22, 2010 2:18 PM

## 2010-10-04 ENCOUNTER — Ambulatory Visit (INDEPENDENT_AMBULATORY_CARE_PROVIDER_SITE_OTHER): Payer: BC Managed Care – PPO | Admitting: Internal Medicine

## 2010-10-04 ENCOUNTER — Encounter: Payer: Self-pay | Admitting: Internal Medicine

## 2010-10-04 VITALS — BP 120/84 | HR 93 | Wt 136.8 lb

## 2010-10-04 DIAGNOSIS — M549 Dorsalgia, unspecified: Secondary | ICD-10-CM
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
## 2010-10-04 NOTE — Assessment & Plan Note (Addendum)
2 or 3 months and history of back pain, some left leg pain, absent left ankle DTR. Most likely the primary problem is in the back , ?radiculopathy. We talked about conservative treatment versus go to the see the orthopedic doctor. She elected the referral. We'll arrange. She also has knee pain, but will be assessed for that at a later time. Knee pain is in the daytime when walking, likely unrelated to the back pain. Declined pain medication

## 2010-10-16 ENCOUNTER — Other Ambulatory Visit: Payer: Self-pay | Admitting: Orthopedic Surgery

## 2010-10-16 DIAGNOSIS — M5410 Radiculopathy, site unspecified: Secondary | ICD-10-CM

## 2010-10-16 DIAGNOSIS — M545 Low back pain, unspecified: Secondary | ICD-10-CM

## 2010-10-17 ENCOUNTER — Ambulatory Visit
Admission: RE | Admit: 2010-10-17 | Discharge: 2010-10-17 | Disposition: A | Discharge status: Home / Self Care | Payer: BC Managed Care – PPO | Source: Ambulatory Visit | Attending: Orthopedic Surgery | Admitting: Orthopedic Surgery

## 2010-10-17 DIAGNOSIS — M545 Low back pain, unspecified: Secondary | ICD-10-CM

## 2010-10-17 DIAGNOSIS — M5410 Radiculopathy, site unspecified: Secondary | ICD-10-CM

## 2010-10-21 ENCOUNTER — Ambulatory Visit
Admission: RE | Admit: 2010-10-21 | Discharge: 2010-10-21 | Disposition: A | Discharge status: Home / Self Care | Payer: BC Managed Care – PPO | Source: Ambulatory Visit | Attending: Orthopedic Surgery | Admitting: Orthopedic Surgery

## 2010-10-21 ENCOUNTER — Other Ambulatory Visit: Payer: Self-pay | Admitting: Orthopedic Surgery

## 2010-10-21 DIAGNOSIS — G971 Other reaction to spinal and lumbar puncture: Secondary | ICD-10-CM

## 2011-02-13 ENCOUNTER — Ambulatory Visit: Payer: BC Managed Care – PPO | Attending: Physical Medicine and Rehabilitation | Admitting: Physical Therapy

## 2011-02-13 DIAGNOSIS — IMO0001 Reserved for inherently not codable concepts without codable children: Secondary | ICD-10-CM | POA: Insufficient documentation

## 2011-02-13 DIAGNOSIS — M545 Low back pain, unspecified: Secondary | ICD-10-CM | POA: Insufficient documentation

## 2011-02-17 ENCOUNTER — Ambulatory Visit: Payer: BC Managed Care – PPO | Admitting: Physical Therapy

## 2011-02-20 ENCOUNTER — Ambulatory Visit: Payer: BC Managed Care – PPO | Admitting: Physical Therapy

## 2011-02-24 ENCOUNTER — Ambulatory Visit: Payer: BC Managed Care – PPO | Admitting: Physical Therapy

## 2011-02-28 ENCOUNTER — Ambulatory Visit: Payer: BC Managed Care – PPO | Admitting: Physical Therapy

## 2011-03-03 ENCOUNTER — Ambulatory Visit: Payer: BC Managed Care – PPO | Admitting: Physical Therapy

## 2011-03-05 ENCOUNTER — Ambulatory Visit: Payer: BC Managed Care – PPO | Admitting: Physical Therapy

## 2011-03-10 ENCOUNTER — Ambulatory Visit: Payer: BC Managed Care – PPO | Attending: Physical Medicine and Rehabilitation | Admitting: Physical Therapy

## 2011-03-10 DIAGNOSIS — M545 Low back pain, unspecified: Secondary | ICD-10-CM | POA: Insufficient documentation

## 2011-03-10 DIAGNOSIS — IMO0001 Reserved for inherently not codable concepts without codable children: Secondary | ICD-10-CM | POA: Insufficient documentation

## 2011-03-14 ENCOUNTER — Ambulatory Visit: Payer: BC Managed Care – PPO | Admitting: Physical Therapy

## 2011-03-17 ENCOUNTER — Ambulatory Visit: Payer: BC Managed Care – PPO | Admitting: Physical Therapy

## 2011-03-20 ENCOUNTER — Ambulatory Visit: Payer: BC Managed Care – PPO | Admitting: Physical Therapy

## 2011-03-31 ENCOUNTER — Ambulatory Visit: Payer: BC Managed Care – PPO | Admitting: Physical Therapy

## 2011-10-21 ENCOUNTER — Telehealth: Payer: Self-pay | Admitting: Internal Medicine

## 2011-10-21 ENCOUNTER — Ambulatory Visit (INDEPENDENT_AMBULATORY_CARE_PROVIDER_SITE_OTHER): Payer: BC Managed Care – PPO | Admitting: Internal Medicine

## 2011-10-21 VITALS — BP 114/74 | HR 65 | Temp 98.4°F | Wt 139.0 lb

## 2011-10-21 DIAGNOSIS — T7840XA Allergy, unspecified, initial encounter: Secondary | ICD-10-CM

## 2011-10-21 MED ORDER — CEPHALEXIN 500 MG PO CAPS: 500.0000 mg | ORAL_CAPSULE | Freq: Four times a day (QID) | ORAL | Status: AC

## 2011-10-21 MED ORDER — PREDNISONE 10 MG PO TABS: ORAL_TABLET | ORAL | Status: DC

## 2011-10-21 NOTE — Patient Instructions (Addendum)
Take meds as prescribed Also benadryl OTC Call if no better in few days Call anytime if the symptoms are severe

## 2011-10-21 NOTE — Assessment & Plan Note (Signed)
Local allergic reaction to a bite, patient not sure if it was a mosquito or something else. She has redness, I wonder if there is some cellulitis as well. Plan: Prednisone, Benadryl, Keflex. See instructions
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
## 2011-10-21 NOTE — Telephone Encounter (Signed)
Caller: Noemy/Patient; PCP: Willow Ora; CB#: [phone];  Call regarding Insect Bite; R forearm near elbow.  Pt not present.  Spoke with unidentified adult female at residence.  Triage declined-requesting appt only per PCP Call, No Triage Guideline.   Appt scheduled for 1300 10/21/11 with Dr Drue Novel.

## 2011-10-21 NOTE — Telephone Encounter (Signed)
Noted  

## 2011-10-22 ENCOUNTER — Encounter: Payer: Self-pay | Admitting: Internal Medicine

## 2011-11-12 ENCOUNTER — Other Ambulatory Visit: Payer: Self-pay | Admitting: Obstetrics and Gynecology

## 2012-03-22 ENCOUNTER — Encounter: Payer: Self-pay | Admitting: Internal Medicine

## 2012-03-22 ENCOUNTER — Ambulatory Visit (INDEPENDENT_AMBULATORY_CARE_PROVIDER_SITE_OTHER): Payer: BC Managed Care – PPO | Admitting: Internal Medicine

## 2012-03-22 VITALS — BP 108/70 | HR 83 | Temp 98.3°F | Wt 139.0 lb

## 2012-03-22 DIAGNOSIS — L989 Disorder of the skin and subcutaneous tissue, unspecified: Secondary | ICD-10-CM
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
## 2012-03-22 NOTE — Assessment & Plan Note (Signed)
Ring like lesions, they don't seem to be fungal. History of granuloma annulare. Plan: Refer to dermatology

## 2012-03-22 NOTE — Patient Instructions (Addendum)
Will schedule a physical exam within a month

## 2012-04-13 ENCOUNTER — Ambulatory Visit (INDEPENDENT_AMBULATORY_CARE_PROVIDER_SITE_OTHER): Payer: BC Managed Care – PPO | Admitting: Internal Medicine

## 2012-04-13 ENCOUNTER — Encounter: Payer: Self-pay | Admitting: Internal Medicine

## 2012-04-13 VITALS — BP 122/74 | HR 70 | Temp 97.9°F | Ht 62.0 in | Wt 140.0 lb

## 2012-04-13 DIAGNOSIS — Z0000 Encounter for general adult medical examination without abnormal findings: Secondary | ICD-10-CM

## 2012-04-13 DIAGNOSIS — R0989 Other specified symptoms and signs involving the circulatory and respiratory systems: Secondary | ICD-10-CM

## 2012-04-13 LAB — CBC WITH DIFFERENTIAL/PLATELET
Basophils Absolute: 0 10*3/uL (ref 0.0–0.1)
Basophils Relative: 0.5 % (ref 0.0–3.0)
Eosinophils Absolute: 0 10*3/uL (ref 0.0–0.7)
Eosinophils Relative: 0.9 % (ref 0.0–5.0)
HCT: 42.2 % (ref 36.0–46.0)
Hemoglobin: 14.1 g/dL (ref 12.0–15.0)
Lymphocytes Relative: 34.7 % (ref 12.0–46.0)
Lymphs Abs: 1.8 10*3/uL (ref 0.7–4.0)
MCHC: 33.5 g/dL (ref 30.0–36.0)
MCV: 99.8 fl (ref 78.0–100.0)
Monocytes Absolute: 0.4 10*3/uL (ref 0.1–1.0)
Monocytes Relative: 7.8 % (ref 3.0–12.0)
Neutro Abs: 3 10*3/uL (ref 1.4–7.7)
Neutrophils Relative %: 56.1 % (ref 43.0–77.0)
Platelets: 186 10*3/uL (ref 150.0–400.0)
RBC: 4.23 Mil/uL (ref 3.87–5.11)
RDW: 12.6 % (ref 11.5–14.6)
WBC: 5.3 10*3/uL (ref 4.5–10.5)

## 2012-04-13 LAB — TSH: TSH: 1.43 u[IU]/mL (ref 0.35–5.50)

## 2012-04-13 LAB — COMPREHENSIVE METABOLIC PANEL
ALT: 23 U/L (ref 0–35)
AST: 19 U/L (ref 0–37)
Albumin: 4.2 g/dL (ref 3.5–5.2)
Alkaline Phosphatase: 42 U/L (ref 39–117)
BUN: 12 mg/dL (ref 6–23)
CO2: 28 mEq/L (ref 19–32)
Calcium: 9.1 mg/dL (ref 8.4–10.5)
Chloride: 102 mEq/L (ref 96–112)
Creatinine, Ser: 0.7 mg/dL (ref 0.4–1.2)
GFR: 91.21 mL/min (ref >60.00)
Glucose, Bld: 91 mg/dL (ref 70–99)
Potassium: 3.9 mEq/L (ref 3.5–5.1)
Sodium: 138 mEq/L (ref 135–145)
Total Bilirubin: 0.8 mg/dL (ref 0.3–1.2)
Total Protein: 7 g/dL (ref 6.0–8.3)

## 2012-04-13 LAB — LIPID PANEL
Cholesterol: 257 mg/dL — ABNORMAL HIGH (ref 0–200)
HDL: 88 mg/dL (ref >39.00)
Total CHOL/HDL Ratio: 3
Triglycerides: 61 mg/dL (ref 0.0–149.0)
VLDL: 12.2 mg/dL (ref 0.0–40.0)

## 2012-04-13 LAB — LDL CHOLESTEROL, DIRECT: Direct LDL: 158.2 mg/dL

## 2012-04-13 NOTE — Assessment & Plan Note (Signed)
Palpable , nontender aorta today with an abdominal bruit  CT of the abdomen 02-2010 showed: --Normal aorta --Abnormal appearance of the celiac access with narrowing just distal to the origin and post dilatation detailed above. Correlation with postprandial abdominal pain is recommended as this may be seen with medial articular ligament syndrome.  Status post GI eval 2011, had EGD. Currently no symptoms to suggest GI a vascular insufficiency Plan: Observation for now

## 2012-04-13 NOTE — Assessment & Plan Note (Addendum)
Td 07 Flu shot explained benefits, declined  Interested in zostavax, rec to check cost w/ her insurance  female care per gyn has a FH of colon polyps, colonoscopy 10/2009, negative, next in 10 years + FH CAD: will need a LDL close to 100 + FH ovarian cancer, f/u  per gyn  counseled about diet-exercise labs
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
## 2012-06-14 ENCOUNTER — Encounter: Payer: BC Managed Care – PPO | Admitting: Internal Medicine

## 2012-06-19 ENCOUNTER — Other Ambulatory Visit: Payer: Self-pay

## 2012-08-01 ENCOUNTER — Telehealth: Payer: Self-pay | Admitting: Internal Medicine

## 2012-08-01 NOTE — Telephone Encounter (Signed)
Advise pt:  I like to check her cholesterol again (FLP dx hyperlipidemia). Please arrange

## 2012-08-02 NOTE — Telephone Encounter (Signed)
Lmovm for pt to call office. °

## 2012-08-03 NOTE — Telephone Encounter (Signed)
Pt scheduled  

## 2012-08-05 ENCOUNTER — Other Ambulatory Visit (INDEPENDENT_AMBULATORY_CARE_PROVIDER_SITE_OTHER): Payer: BC Managed Care – PPO

## 2012-08-05 DIAGNOSIS — E785 Hyperlipidemia, unspecified: Secondary | ICD-10-CM

## 2012-08-05 LAB — LIPID PANEL
Cholesterol: 239 mg/dL — ABNORMAL HIGH (ref 0–200)
HDL: 86.1 mg/dL (ref >39.00)
Total CHOL/HDL Ratio: 3
Triglycerides: 73 mg/dL (ref 0.0–149.0)
VLDL: 14.6 mg/dL (ref 0.0–40.0)

## 2012-08-05 LAB — LDL CHOLESTEROL, DIRECT: Direct LDL: 137.6 mg/dL

## 2012-11-29 ENCOUNTER — Other Ambulatory Visit: Payer: Self-pay | Admitting: Obstetrics and Gynecology

## 2013-01-12 ENCOUNTER — Telehealth: Payer: Self-pay

## 2013-01-12 NOTE — Telephone Encounter (Signed)
Patient presents to office to have her right upper arm (subq)  accessed. Patient was give a Tdap in the subq region. Area is red, edematous, and cool to the touch. Patient states that there is no pain but it itches when she takes a hot shower. Patient questioned if the medication actually was in. Advised patient that once it was in it is there and there would be no need for any re-administering.  Spoke with Dr. Beverely Low and she agrees with the information. Patient given precautions to look for. Patient agrees with plan.

## 2013-03-10 ENCOUNTER — Other Ambulatory Visit: Payer: Self-pay

## 2013-12-15 ENCOUNTER — Encounter: Payer: Self-pay | Admitting: Gastroenterology

## 2014-01-06 ENCOUNTER — Encounter: Payer: Self-pay | Admitting: Internal Medicine

## 2014-01-06 ENCOUNTER — Ambulatory Visit (INDEPENDENT_AMBULATORY_CARE_PROVIDER_SITE_OTHER): Payer: BC Managed Care – PPO | Admitting: Internal Medicine

## 2014-01-06 VITALS — BP 102/72 | HR 62 | Temp 98.2°F | Ht 61.0 in | Wt 140.2 lb

## 2014-01-06 DIAGNOSIS — R0989 Other specified symptoms and signs involving the circulatory and respiratory systems: Secondary | ICD-10-CM

## 2014-01-06 DIAGNOSIS — Z0000 Encounter for general adult medical examination without abnormal findings: Secondary | ICD-10-CM

## 2014-01-06 LAB — HM MAMMOGRAPHY

## 2014-01-06 LAB — HM PAP SMEAR

## 2014-01-06 NOTE — Progress Notes (Signed)
Pre visit review using our clinic review tool, if applicable. No additional management support is needed unless otherwise documented below in the visit note. 

## 2014-01-06 NOTE — Assessment & Plan Note (Addendum)
Td 2014-- had a local reaction  zostavax, discussed before, will wait until 71  female care per gyn has a FH of colon polyps, colonoscopy 10/2009, negative, next in 10 years + FH CAD: will need a LDL close to 100 + FH ovarian cancer, f/u  per gyn  counseled about diet-exercise Menopausal symptoms well-controlled with OTCs labs

## 2014-01-06 NOTE — Assessment & Plan Note (Signed)
See previous entry, she remains asymptomatic , physical exam unchanged from previous.
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
## 2014-01-06 NOTE — Patient Instructions (Signed)
Stop by the front desk and schedule labs to be done within few days (fasting) FLP, CBC, CMP, TSH--- dx V70  Please come back to the office  once a year , fasting , for a physical exam  You can schedule that visit today

## 2014-01-10 ENCOUNTER — Other Ambulatory Visit (INDEPENDENT_AMBULATORY_CARE_PROVIDER_SITE_OTHER): Payer: BC Managed Care – PPO

## 2014-01-10 DIAGNOSIS — Z0000 Encounter for general adult medical examination without abnormal findings: Secondary | ICD-10-CM

## 2014-01-11 LAB — COMPREHENSIVE METABOLIC PANEL
ALT: 14 U/L (ref 0–35)
AST: 17 U/L (ref 0–37)
Albumin: 4 g/dL (ref 3.5–5.2)
Alkaline Phosphatase: 41 U/L (ref 39–117)
BUN: 10 mg/dL (ref 6–23)
CO2: 30 mEq/L (ref 19–32)
Calcium: 8.8 mg/dL (ref 8.4–10.5)
Chloride: 103 mEq/L (ref 96–112)
Creatinine, Ser: 0.7 mg/dL (ref 0.4–1.2)
GFR: 93.66 mL/min (ref >60.00)
Glucose, Bld: 80 mg/dL (ref 70–99)
Potassium: 4.1 mEq/L (ref 3.5–5.1)
Sodium: 138 mEq/L (ref 135–145)
Total Bilirubin: 0.5 mg/dL (ref 0.2–1.2)
Total Protein: 6.6 g/dL (ref 6.0–8.3)

## 2014-01-11 LAB — CBC WITH DIFFERENTIAL/PLATELET
Basophils Absolute: 0 10*3/uL (ref 0.0–0.1)
Basophils Relative: 0.6 % (ref 0.0–3.0)
Eosinophils Absolute: 0 10*3/uL (ref 0.0–0.7)
Eosinophils Relative: 0.9 % (ref 0.0–5.0)
HCT: 39.4 % (ref 36.0–46.0)
Hemoglobin: 13.5 g/dL (ref 12.0–15.0)
Lymphocytes Relative: 32.2 % (ref 12.0–46.0)
Lymphs Abs: 1.5 10*3/uL (ref 0.7–4.0)
MCHC: 34.3 g/dL (ref 30.0–36.0)
MCV: 98.4 fl (ref 78.0–100.0)
Monocytes Absolute: 0.3 10*3/uL (ref 0.1–1.0)
Monocytes Relative: 7 % (ref 3.0–12.0)
Neutro Abs: 2.8 10*3/uL (ref 1.4–7.7)
Neutrophils Relative %: 59.3 % (ref 43.0–77.0)
Platelets: 179 10*3/uL (ref 150.0–400.0)
RBC: 4 Mil/uL (ref 3.87–5.11)
RDW: 13 % (ref 11.5–15.5)
WBC: 4.7 10*3/uL (ref 4.0–10.5)

## 2014-01-11 LAB — LIPID PANEL
Cholesterol: 210 mg/dL — ABNORMAL HIGH (ref 0–200)
HDL: 69.1 mg/dL (ref >39.00)
LDL Cholesterol: 122 mg/dL — ABNORMAL HIGH (ref 0–99)
NonHDL: 140.9
Total CHOL/HDL Ratio: 3
Triglycerides: 94 mg/dL (ref 0.0–149.0)
VLDL: 18.8 mg/dL (ref 0.0–40.0)

## 2014-01-11 LAB — TSH: TSH: 2.27 u[IU]/mL (ref 0.35–4.50)

## 2014-02-03 ENCOUNTER — Other Ambulatory Visit: Payer: Self-pay | Admitting: Obstetrics and Gynecology

## 2014-02-08 LAB — CYTOLOGY - PAP

## 2014-02-23 ENCOUNTER — Other Ambulatory Visit: Payer: Self-pay | Admitting: Obstetrics and Gynecology

## 2014-02-24 LAB — CYTOLOGY - PAP

## 2014-02-27 ENCOUNTER — Ambulatory Visit (INDEPENDENT_AMBULATORY_CARE_PROVIDER_SITE_OTHER): Payer: BC Managed Care – PPO | Admitting: Internal Medicine

## 2014-02-27 ENCOUNTER — Encounter: Payer: Self-pay | Admitting: Internal Medicine

## 2014-02-27 VITALS — BP 120/76 | HR 83 | Temp 98.5°F | Wt 140.2 lb

## 2014-02-27 DIAGNOSIS — J209 Acute bronchitis, unspecified: Secondary | ICD-10-CM

## 2014-02-27 MED ORDER — AZITHROMYCIN 250 MG PO TABS: ORAL_TABLET | ORAL | Status: DC

## 2014-02-27 MED ORDER — HYDROCODONE-HOMATROPINE 5-1.5 MG/5ML PO SYRP: 5.0000 mL | ORAL_SOLUTION | Freq: Every evening | ORAL | Status: DC | PRN

## 2014-02-27 NOTE — Patient Instructions (Signed)
Rest, fluids , tylenol If  cough, take Robitussin DM   as needed  If the cough is severe, take hydrocodone Get sudafed , as needed for congestion Take the antibiotic as prescribed  (zithromax) Call if not gradually better over the next  10 days Call anytime if the symptoms are severe
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
## 2014-02-27 NOTE — Progress Notes (Signed)
Pre visit review using our clinic review tool, if applicable. No additional management support is needed unless otherwise documented below in the visit note. 

## 2014-08-14 ENCOUNTER — Ambulatory Visit (INDEPENDENT_AMBULATORY_CARE_PROVIDER_SITE_OTHER): Payer: BLUE CROSS/BLUE SHIELD | Admitting: Physician Assistant

## 2014-08-14 ENCOUNTER — Telehealth: Payer: Self-pay | Admitting: Internal Medicine

## 2014-08-14 ENCOUNTER — Encounter: Payer: Self-pay | Admitting: Physician Assistant

## 2014-08-14 VITALS — BP 128/70 | HR 78 | Temp 98.4°F | Resp 16 | Ht 61.0 in | Wt 143.2 lb

## 2014-08-14 DIAGNOSIS — K1379 Other lesions of oral mucosa: Secondary | ICD-10-CM

## 2014-08-14 MED ORDER — MAGIC MOUTHWASH: 5.0000 mL | Freq: Three times a day (TID) | ORAL | Status: DC | PRN

## 2014-08-14 NOTE — Telephone Encounter (Signed)
LMOM with contact name and number RE: #100 mL dispense on Magic Mouthwash for patient, Ok per VO provider /SLS

## 2014-08-14 NOTE — Progress Notes (Signed)
Pre visit review using our clinic review tool, if applicable. No additional management support is needed unless otherwise documented below in the visit note/SLS  

## 2014-08-14 NOTE — Patient Instructions (Signed)
Please stay well hydrated.  Place a humidifier in the bedroom. Continue Mucinex for congestion.  Use the Magic Mouthwash as directed over the next couple of days. Call or return to clinic if symptoms are not improving.

## 2014-08-14 NOTE — Telephone Encounter (Signed)
1 part maalox 1 part viscous lidocaine 2%

## 2014-08-14 NOTE — Progress Notes (Signed)
Patient presents to clinic today c/o tingling at back of tongue associated with productive cough and PND x 3-4 days.  Endorses URI symptoms are improving, but wanted tongue looked at due to + family hx of oropharyngeal cancer.  Patient is a former smoker with 1 pack-year smoking history.  Past Medical History  Diagnosis Date  . Deafness   . Dry eye syndrome     on restasis and fish oil  . Ovarian cyst   . Granuloma annulare   . Back pain     local injections 2012   . Palpable abd. aorta 02/25/2010  . Menopause     Current Outpatient Prescriptions on File Prior to Visit  Medication Sig Dispense Refill  . Calcium Carbonate (CALTRATE 600 PO) Take 1 tablet by mouth daily.    . cycloSPORINE (RESTASIS) 0.05 % ophthalmic emulsion 1 drop 2 (two) times daily.      Gardiner Ramus, Angelica sinensis, (DONG QUAI PO) Take 2 capsules by mouth daily.    . fish oil-omega-3 fatty acids 1000 MG capsule Take 2 g by mouth daily.       No current facility-administered medications on file prior to visit.    Allergies  Allergen Reactions  . Ibuprofen Nausea And Vomiting    Severe stomach upset  . Shrimp [Shellfish Allergy] Diarrhea and Nausea And Vomiting    SNAILS ALSO  . Cetirizine Hcl Other (See Comments)    Headaches  . Loratadine Other (See Comments)    Headaches  . Tetanus Toxoids     Had self-resolved local reaction    Family History  Problem Relation Age of Onset  . Heart attack Father     F dx age 63 had a CABG  . Diabetes Neg Hx   . Stroke Mother 83  . Colon cancer Neg Hx   . Colon polyps Mother   . Breast cancer Neg Hx   . Ovarian cancer      aunt   . CAD Mother     age of onset?    History   Social History  . Marital Status: Married    Spouse Name: N/A  . Number of Children: 2  . Years of Education: N/A   Occupational History  . [employer] Physiological scientist   Social History Main Topics  . Smoking status: Never Smoker   . Smokeless tobacco: Never Used  . Alcohol Use:  Yes     Comment: wine- socially  . Drug Use: No  . Sexual Activity: Not on file   Other Topics Concern  . None   Social History Narrative   Household-- pt and husband   Review of Systems - See HPI.  All other ROS are negative.  BP 128/70 mmHg  Pulse 78  Temp(Src) 98.4 F (36.9 C) (Oral)  Resp 16  Ht  (1.549 m)  Wt 143 lb 4 oz (64.978 kg)  BMI 27.08 kg/m2  SpO2 99%  Physical Exam  Constitutional: She is oriented to person, place, and time and well-developed, well-nourished, and in no distress.  HENT:  Head: Normocephalic and atraumatic.  Right Ear: Tympanic membrane, external ear and ear canal normal.  Left Ear: Tympanic membrane, external ear and ear canal normal.  Nose: Nose normal.  Mouth/Throat: Oropharynx is clear and moist and mucous membranes are normal. No oral lesions. Normal dentition. No dental abscesses, uvula swelling, lacerations or dental caries.  Eyes: Conjunctivae are normal.  Neck: Neck supple.  Cardiovascular: Normal rate,  regular rhythm, normal heart sounds and intact distal pulses.   Pulmonary/Chest: Effort normal and breath sounds normal. No respiratory distress. She has no wheezes. She has no rales. She exhibits no tenderness.  Neurological: She is alert and oriented to person, place, and time.  Skin: Skin is warm and dry. No rash noted.  Psychiatric: Affect normal.  Vitals reviewed.  Assessment/Plan: Pain in mouth Exam only remarkable for enlarged taste buds. No oral lesion or luekoplakia noted.  URI symptoms resolving.  Supportive measures discussed.  Rx magic Mouthwash.

## 2014-08-14 NOTE — Telephone Encounter (Signed)
[facility] Pharmacist would like to know if patient can have of the magic mouthwash- they state the lidocaine is so think that this dosage is easier to apply.

## 2014-08-14 NOTE — Assessment & Plan Note (Signed)
Exam only remarkable for enlarged taste buds. No oral lesion or luekoplakia noted.  URI symptoms resolving.  Supportive measures discussed.  Rx magic Mouthwash.

## 2014-08-14 NOTE — Telephone Encounter (Signed)
Caller name:aaron Relation to pt: Call back [phone] Pharmacy [facility]  Reason for call: needing verification for the magic mouth wash for the pt, please call back.

## 2014-08-14 NOTE — Telephone Encounter (Signed)
Absolutely.

## 2015-01-08 ENCOUNTER — Emergency Department (HOSPITAL_COMMUNITY)
Admission: EM | Admit: 2015-01-08 | Discharge: 2015-01-08 | Disposition: A | Discharge status: Home / Self Care | Payer: BLUE CROSS/BLUE SHIELD | Attending: Emergency Medicine | Admitting: Emergency Medicine

## 2015-01-08 ENCOUNTER — Encounter (HOSPITAL_COMMUNITY): Payer: Self-pay | Admitting: Emergency Medicine

## 2015-01-08 DIAGNOSIS — Z7982 Long term (current) use of aspirin: Secondary | ICD-10-CM | POA: Insufficient documentation

## 2015-01-08 DIAGNOSIS — Z8669 Personal history of other diseases of the nervous system and sense organs: Secondary | ICD-10-CM | POA: Diagnosis not present

## 2015-01-08 DIAGNOSIS — Z7951 Long term (current) use of inhaled steroids: Secondary | ICD-10-CM | POA: Insufficient documentation

## 2015-01-08 DIAGNOSIS — R002 Palpitations: Secondary | ICD-10-CM

## 2015-01-08 DIAGNOSIS — Z8742 Personal history of other diseases of the female genital tract: Secondary | ICD-10-CM | POA: Diagnosis not present

## 2015-01-08 DIAGNOSIS — Z79899 Other long term (current) drug therapy: Secondary | ICD-10-CM | POA: Diagnosis not present

## 2015-01-08 DIAGNOSIS — H9190 Unspecified hearing loss, unspecified ear: Secondary | ICD-10-CM | POA: Insufficient documentation

## 2015-01-08 LAB — CBC WITH DIFFERENTIAL/PLATELET
Basophils Absolute: 0 10*3/uL (ref 0.0–0.1)
Basophils Relative: 1 % (ref 0–1)
Eosinophils Absolute: 0.1 10*3/uL (ref 0.0–0.7)
Eosinophils Relative: 1 % (ref 0–5)
HCT: 40.3 % (ref 36.0–46.0)
Hemoglobin: 14 g/dL (ref 12.0–15.0)
Lymphocytes Relative: 35 % (ref 12–46)
Lymphs Abs: 1.8 10*3/uL (ref 0.7–4.0)
MCH: 33.3 pg (ref 26.0–34.0)
MCHC: 34.7 g/dL (ref 30.0–36.0)
MCV: 96 fL (ref 78.0–100.0)
Monocytes Absolute: 0.4 10*3/uL (ref 0.1–1.0)
Monocytes Relative: 8 % (ref 3–12)
Neutro Abs: 2.8 10*3/uL (ref 1.7–7.7)
Neutrophils Relative %: 55 % (ref 43–77)
Platelets: 188 10*3/uL (ref 150–400)
RBC: 4.2 MIL/uL (ref 3.87–5.11)
RDW: 12.3 % (ref 11.5–15.5)
WBC: 5 10*3/uL (ref 4.0–10.5)

## 2015-01-08 LAB — BASIC METABOLIC PANEL
Anion gap: 7 (ref 5–15)
BUN: 14 mg/dL (ref 6–20)
CO2: 24 mmol/L (ref 22–32)
Calcium: 9.3 mg/dL (ref 8.9–10.3)
Chloride: 107 mmol/L (ref 101–111)
Creatinine, Ser: 0.69 mg/dL (ref 0.44–1.00)
GFR calc Af Amer: >60 mL/min (ref >60)
GFR calc non Af Amer: >60 mL/min (ref >60)
Glucose, Bld: 93 mg/dL (ref 65–99)
Potassium: 5.1 mmol/L (ref 3.5–5.1)
Sodium: 138 mmol/L (ref 135–145)

## 2015-01-08 LAB — TSH: TSH: 2.198 u[IU]/mL (ref 0.350–4.500)

## 2015-01-08 LAB — TROPONIN I: Troponin I: <0.03 ng/mL (ref <0.031)
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
## 2015-01-08 NOTE — Discharge Instructions (Signed)

## 2015-01-08 NOTE — ED Notes (Signed)
Pt states while reading a book around 0800 she began to feel palpitations in her chest,"felt like my heart was skipping a beat." Then she states she felt a pressure in her chest. The pressure has now subsided and every now and then still feels a "palpitation" but denies any pain, n/v or diaphoresis.

## 2015-01-12 ENCOUNTER — Encounter: Payer: Self-pay | Admitting: Internal Medicine

## 2015-01-12 ENCOUNTER — Ambulatory Visit (INDEPENDENT_AMBULATORY_CARE_PROVIDER_SITE_OTHER): Payer: BLUE CROSS/BLUE SHIELD | Admitting: Internal Medicine

## 2015-01-12 VITALS — BP 124/68 | HR 65 | Temp 97.8°F | Ht 62.0 in | Wt 138.5 lb

## 2015-01-12 DIAGNOSIS — R002 Palpitations: Secondary | ICD-10-CM | POA: Diagnosis not present
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
## 2015-01-12 NOTE — Progress Notes (Signed)
Pre visit review using our clinic review tool, if applicable. No additional management support is needed unless otherwise documented below in the visit note. 

## 2015-01-12 NOTE — Patient Instructions (Signed)
  Next visit  for a  complete physical exam in 2 months Please schedule an appointment at the front desk Please come back fasting  Please call anytime if you have recurrent or persistent palpitations. Go to the ER if the symptoms are severe

## 2015-04-24 ENCOUNTER — Ambulatory Visit (INDEPENDENT_AMBULATORY_CARE_PROVIDER_SITE_OTHER): Payer: BLUE CROSS/BLUE SHIELD | Admitting: Internal Medicine

## 2015-04-24 ENCOUNTER — Encounter: Payer: Self-pay | Admitting: Internal Medicine

## 2015-04-24 VITALS — BP 106/74 | HR 70 | Temp 97.7°F | Ht 62.0 in | Wt 141.2 lb

## 2015-04-24 DIAGNOSIS — Z0000 Encounter for general adult medical examination without abnormal findings: Secondary | ICD-10-CM | POA: Diagnosis not present

## 2015-04-24 DIAGNOSIS — Z114 Encounter for screening for human immunodeficiency virus [HIV]: Secondary | ICD-10-CM

## 2015-04-24 NOTE — Patient Instructions (Signed)
Before you leave the office:  Go to the front desk: Please schedule labs to be done within few days (fasting)   Schedule a complete physical exam to be done in one year Please be fasting  We can also see you in the afternoon and check labs the next day

## 2015-04-24 NOTE — Assessment & Plan Note (Signed)
Td 2014-- had a local reaction zostavax, discussed before, will wait until 5560  doesn't take flu shots, benefits discussed  female care per gyn has a FH of colon polyps, colonoscopy 10/2009, negative, next in 10 years + FH CAD: will need a LDL close to 100 + FH ovarian cancer, f/u per gyn  Eats healthy, exercises 3 times a week ! Labs
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
## 2015-04-24 NOTE — Progress Notes (Signed)
Pre visit review using our clinic review tool, if applicable. No additional management support is needed unless otherwise documented below in the visit note. 

## 2015-04-25 ENCOUNTER — Other Ambulatory Visit (INDEPENDENT_AMBULATORY_CARE_PROVIDER_SITE_OTHER): Payer: BLUE CROSS/BLUE SHIELD

## 2015-04-25 DIAGNOSIS — Z114 Encounter for screening for human immunodeficiency virus [HIV]: Secondary | ICD-10-CM

## 2015-04-25 DIAGNOSIS — Z0000 Encounter for general adult medical examination without abnormal findings: Secondary | ICD-10-CM

## 2015-04-25 LAB — BASIC METABOLIC PANEL
BUN: 17 mg/dL (ref 6–23)
CO2: 25 mEq/L (ref 19–32)
Calcium: 8.9 mg/dL (ref 8.4–10.5)
Chloride: 106 mEq/L (ref 96–112)
Creatinine, Ser: 0.77 mg/dL (ref 0.40–1.20)
GFR: 82.14 mL/min (ref >60.00)
Glucose, Bld: 91 mg/dL (ref 70–99)
Potassium: 4.3 mEq/L (ref 3.5–5.1)
Sodium: 139 mEq/L (ref 135–145)

## 2015-04-25 LAB — LIPID PANEL
Cholesterol: 235 mg/dL — ABNORMAL HIGH (ref 0–200)
HDL: 74.5 mg/dL (ref >39.00)
LDL Cholesterol: 144 mg/dL — ABNORMAL HIGH (ref 0–99)
NonHDL: 160.5
Total CHOL/HDL Ratio: 3
Triglycerides: 85 mg/dL (ref 0.0–149.0)
VLDL: 17 mg/dL (ref 0.0–40.0)

## 2015-04-26 LAB — HIV ANTIBODY (ROUTINE TESTING W REFLEX): HIV 1&2 Ab, 4th Generation: NONREACTIVE

## 2015-04-30 LAB — VITAMIN D 1,25 DIHYDROXY
Vitamin D 1, 25 (OH)2 Total: 44 pg/mL (ref 18–72)
Vitamin D2 1, 25 (OH)2: <8 pg/mL
Vitamin D3 1, 25 (OH)2: 44 pg/mL

## 2016-03-12 ENCOUNTER — Telehealth: Payer: Self-pay | Admitting: Internal Medicine

## 2016-03-12 DIAGNOSIS — Z0000 Encounter for general adult medical examination without abnormal findings: Secondary | ICD-10-CM

## 2016-03-12 NOTE — Telephone Encounter (Signed)
Relation to JX:BJYNpt:self Call back number:[phone]   Reason for call:  Patient sent Biometric Screening form via email placed in paperwork bin. Patient last CPE 04/24/2015, informed patient turn around time is 5 to 7 business day. Please see patient email below     From: [email or web address]> Sent: Tuesday, March 04, 2016 10:51 AM To: Pamela MiyamotoBell, [name] Subject: Biometric Screening Form   Hi [name],  I just spoke with you on the phone about having Dr. Drue NovelPaz approve my having some blood work done for my health insurance to receive $250 cash back. Attached is the form they need to have filled out - it shows that I need to have the bloodwork done before Nov. 15. I do have a physical scheduled with Dr. Drue NovelPaz on Jan. 15 but would like to see if I can have the bloodwork taken care of.  Thanks for your help!  Pamela ShellingKaren De [place]   --- Originally sent by [email or web address] on Mar 04, 2016 4:00 PM --- Hello Unfortunately I cant locate you in the system, whats your date of birth and your contact number?    From: [email or web address] Sent: Wednesday, March 12, 2016 9:40 AM To: [name], [name] Subject: RE: Re: Biometric Screening Form / Secure   I didn't realize you sent me this message over a week ago. My date of birth is Oct 03, 1960. My cell phone is [phone]. Please let me know asap when I ca be done before Nov. 15. Many thanks!

## 2016-03-13 NOTE — Telephone Encounter (Signed)
Patient scheduled for labs only for 03/14/2016.

## 2016-03-13 NOTE — Telephone Encounter (Signed)
Let's do her fasting blood work ASAP in preparation for her next physical exam and with that information we will fill her screening form. We also need current BP, height and weight. We can simply check her BP when she comes by the office. Labs: FLP, CMP, CBC From the next: Please schedule labs ASAP

## 2016-03-13 NOTE — Telephone Encounter (Signed)
Form w/ PCP for completion.  

## 2016-03-13 NOTE — Telephone Encounter (Signed)
Noted.  Form received and forwarded to provider.

## 2016-03-14 ENCOUNTER — Other Ambulatory Visit (INDEPENDENT_AMBULATORY_CARE_PROVIDER_SITE_OTHER): Payer: BLUE CROSS/BLUE SHIELD

## 2016-03-14 ENCOUNTER — Ambulatory Visit (INDEPENDENT_AMBULATORY_CARE_PROVIDER_SITE_OTHER): Payer: BLUE CROSS/BLUE SHIELD | Admitting: Internal Medicine

## 2016-03-14 VITALS — BP 112/84 | Ht 61.5 in | Wt 140.8 lb

## 2016-03-14 DIAGNOSIS — Z0189 Encounter for other specified special examinations: Secondary | ICD-10-CM

## 2016-03-14 DIAGNOSIS — Z0000 Encounter for general adult medical examination without abnormal findings: Secondary | ICD-10-CM

## 2016-03-14 DIAGNOSIS — Z008 Encounter for other general examination: Secondary | ICD-10-CM

## 2016-03-14 LAB — COMPREHENSIVE METABOLIC PANEL
ALT: 12 U/L (ref 0–35)
AST: 15 U/L (ref 0–37)
Albumin: 4.3 g/dL (ref 3.5–5.2)
Alkaline Phosphatase: 38 U/L — ABNORMAL LOW (ref 39–117)
BUN: 11 mg/dL (ref 6–23)
CO2: 27 mEq/L (ref 19–32)
Calcium: 8.9 mg/dL (ref 8.4–10.5)
Chloride: 105 mEq/L (ref 96–112)
Creatinine, Ser: 0.72 mg/dL (ref 0.40–1.20)
GFR: 88.48 mL/min (ref >60.00)
Glucose, Bld: 91 mg/dL (ref 70–99)
Potassium: 3.9 mEq/L (ref 3.5–5.1)
Sodium: 140 mEq/L (ref 135–145)
Total Bilirubin: 0.6 mg/dL (ref 0.2–1.2)
Total Protein: 6.5 g/dL (ref 6.0–8.3)

## 2016-03-14 LAB — LIPID PANEL
Cholesterol: 233 mg/dL — ABNORMAL HIGH (ref 0–200)
HDL: 80.1 mg/dL (ref >39.00)
LDL Cholesterol: 134 mg/dL — ABNORMAL HIGH (ref 0–99)
NonHDL: 152.47
Total CHOL/HDL Ratio: 3
Triglycerides: 94 mg/dL (ref 0.0–149.0)
VLDL: 18.8 mg/dL (ref 0.0–40.0)

## 2016-03-14 LAB — CBC WITH DIFFERENTIAL/PLATELET
Basophils Absolute: 0 10*3/uL (ref 0.0–0.1)
Basophils Relative: 0.6 % (ref 0.0–3.0)
Eosinophils Absolute: 0.1 10*3/uL (ref 0.0–0.7)
Eosinophils Relative: 0.9 % (ref 0.0–5.0)
HCT: 39 % (ref 36.0–46.0)
Hemoglobin: 13.4 g/dL (ref 12.0–15.0)
Lymphocytes Relative: 28 % (ref 12.0–46.0)
Lymphs Abs: 1.5 10*3/uL (ref 0.7–4.0)
MCHC: 34.3 g/dL (ref 30.0–36.0)
MCV: 97 fl (ref 78.0–100.0)
Monocytes Absolute: 0.4 10*3/uL (ref 0.1–1.0)
Monocytes Relative: 6.8 % (ref 3.0–12.0)
Neutro Abs: 3.5 10*3/uL (ref 1.4–7.7)
Neutrophils Relative %: 63.7 % (ref 43.0–77.0)
Platelets: 183 10*3/uL (ref 150.0–400.0)
RBC: 4.02 Mil/uL (ref 3.87–5.11)
RDW: 12.7 % (ref 11.5–15.5)
WBC: 5.4 10*3/uL (ref 4.0–10.5)

## 2016-03-14 NOTE — Telephone Encounter (Signed)
Form received from PCP. Awaiting results.

## 2016-03-14 NOTE — Progress Notes (Signed)
Pre visit review using our clinic review tool, if applicable. No additional management support is needed unless otherwise documented below in the visit note.  Pt here for height, weight, and BP for biometric form per 03/12/16 phone note. Form received and remainder will be filled out and returned to pt once her labs result.   Starla Linkarolyn J Georgeann Brinkman, RN

## 2016-03-14 NOTE — Telephone Encounter (Signed)
Form w/ Eber Jonesarolyn, awaiting lab results.

## 2016-03-17 NOTE — Telephone Encounter (Signed)
Form completed and faxed as directed. Fax confirmation received.

## 2016-05-05 DIAGNOSIS — S52501A Unspecified fracture of the lower end of right radius, initial encounter for closed fracture: Secondary | ICD-10-CM

## 2016-05-05 HISTORY — DX: Unspecified fracture of the lower end of right radius, initial encounter for closed fracture: S52.501A

## 2016-05-19 ENCOUNTER — Ambulatory Visit (INDEPENDENT_AMBULATORY_CARE_PROVIDER_SITE_OTHER): Payer: BLUE CROSS/BLUE SHIELD | Admitting: Internal Medicine

## 2016-05-19 ENCOUNTER — Encounter: Payer: Self-pay | Admitting: Internal Medicine

## 2016-05-19 VITALS — BP 108/78 | HR 77 | Temp 97.8°F | Resp 14 | Ht 62.0 in | Wt 141.5 lb

## 2016-05-19 DIAGNOSIS — Z0000 Encounter for general adult medical examination without abnormal findings: Secondary | ICD-10-CM

## 2016-05-19 NOTE — Assessment & Plan Note (Addendum)
Td 2014-- had a local reaction zostavax, discussed before, will wait until 4960  Declined a flu shot female care per gyn + FH of colon polyps, colonoscopy 10/2009, negative, next in 10 years + FH CAD: will need a LDL close to 100 (last LDL 134), will work on lifestyle + FH ovarian cancer, f/u per gyn  Previous labs from few weeks ago reviewed with the patient. Room for improvement on exercise, encouraged to get more active. Diet is very good. RTC one year

## 2016-05-19 NOTE — Progress Notes (Signed)
Pre visit review using our clinic review tool, if applicable. No additional management support is needed unless otherwise documented below in the visit note. 

## 2016-05-19 NOTE — Patient Instructions (Signed)
  GO TO THE FRONT DESK Schedule your next appointment for a  Physical in 1 year 
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
## 2016-05-20 DIAGNOSIS — Z09 Encounter for follow-up examination after completed treatment for conditions other than malignant neoplasm: Secondary | ICD-10-CM | POA: Insufficient documentation

## 2016-05-20 NOTE — Assessment & Plan Note (Signed)
56 year old lady, here for a CPX ,doing great. URI: rec  conservative treatment RTC one year

## 2016-07-22 ENCOUNTER — Ambulatory Visit: Payer: BLUE CROSS/BLUE SHIELD | Attending: Orthopedic Surgery | Admitting: Physical Therapy

## 2016-07-22 ENCOUNTER — Encounter: Payer: Self-pay | Admitting: Physical Therapy

## 2016-07-22 DIAGNOSIS — M25531 Pain in right wrist: Secondary | ICD-10-CM | POA: Diagnosis present

## 2016-07-22 DIAGNOSIS — M25631 Stiffness of right wrist, not elsewhere classified: Secondary | ICD-10-CM | POA: Insufficient documentation
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
## 2016-08-06 ENCOUNTER — Ambulatory Visit: Payer: BLUE CROSS/BLUE SHIELD | Attending: Orthopedic Surgery | Admitting: Physical Therapy

## 2016-08-06 ENCOUNTER — Encounter: Payer: Self-pay | Admitting: Physical Therapy

## 2016-08-06 DIAGNOSIS — M25631 Stiffness of right wrist, not elsewhere classified: Secondary | ICD-10-CM | POA: Diagnosis present

## 2016-08-06 DIAGNOSIS — M25531 Pain in right wrist: Secondary | ICD-10-CM | POA: Insufficient documentation
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
## 2016-08-21 ENCOUNTER — Encounter: Payer: Self-pay | Admitting: Physical Therapy

## 2016-08-21 ENCOUNTER — Ambulatory Visit: Payer: BLUE CROSS/BLUE SHIELD | Admitting: Physical Therapy

## 2016-08-21 DIAGNOSIS — M25631 Stiffness of right wrist, not elsewhere classified: Secondary | ICD-10-CM

## 2016-08-21 DIAGNOSIS — M25531 Pain in right wrist: Secondary | ICD-10-CM | POA: Diagnosis not present
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
## 2017-03-13 ENCOUNTER — Ambulatory Visit (INDEPENDENT_AMBULATORY_CARE_PROVIDER_SITE_OTHER): Payer: BLUE CROSS/BLUE SHIELD | Admitting: Internal Medicine

## 2017-03-13 ENCOUNTER — Encounter: Payer: Self-pay | Admitting: Internal Medicine

## 2017-03-13 VITALS — BP 124/88 | HR 78 | Temp 98.6°F | Resp 14 | Ht 62.0 in

## 2017-03-13 DIAGNOSIS — F329 Major depressive disorder, single episode, unspecified: Secondary | ICD-10-CM

## 2017-03-13 DIAGNOSIS — F419 Anxiety disorder, unspecified: Secondary | ICD-10-CM | POA: Diagnosis not present

## 2017-03-13 DIAGNOSIS — F32A Depression, unspecified: Secondary | ICD-10-CM

## 2017-03-13 MED ORDER — ALPRAZOLAM 0.5 MG PO TABS: ORAL_TABLET | ORAL | 0 refills | Status: DC
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
## 2017-03-13 NOTE — Patient Instructions (Signed)
Schedule appointment for 3 weeks, follow-up  For anxiety and insomnia: Takes Xanax as needed, see prescrition  Please see a counselor  Think about starting the SSRI such as Prozac, Zoloft, Lexapro.  Let me know  Call anytime if problems or questions

## 2017-03-13 NOTE — Progress Notes (Signed)
Pre visit review using our clinic review tool, if applicable. No additional management support is needed unless otherwise documented below in the visit note. 

## 2017-03-15 DIAGNOSIS — F32A Depression, unspecified: Secondary | ICD-10-CM | POA: Insufficient documentation

## 2017-03-15 DIAGNOSIS — F329 Major depressive disorder, single episode, unspecified: Secondary | ICD-10-CM | POA: Insufficient documentation

## 2017-03-15 DIAGNOSIS — F419 Anxiety disorder, unspecified: Secondary | ICD-10-CM | POA: Insufficient documentation
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
## 2017-03-16 ENCOUNTER — Encounter: Payer: Self-pay | Admitting: Internal Medicine

## 2017-03-16 MED ORDER — FLUOXETINE HCL 20 MG PO TABS: ORAL_TABLET | ORAL | 1 refills | Status: DC

## 2017-03-16 NOTE — Telephone Encounter (Signed)
fluoxetine 20mg  tablets: 1/2 a day for 5 days, then 1 daily. #30, 1 refills  Received: Today  Message Contents  Wanda PlumpPaz, Jose E, MD  Conrad Burlingtonanter, Paetyn Pietrzak, CMA

## 2017-04-03 ENCOUNTER — Ambulatory Visit (INDEPENDENT_AMBULATORY_CARE_PROVIDER_SITE_OTHER): Payer: BLUE CROSS/BLUE SHIELD | Admitting: Internal Medicine

## 2017-04-03 ENCOUNTER — Encounter: Payer: Self-pay | Admitting: Internal Medicine

## 2017-04-03 VITALS — BP 118/80 | HR 73 | Temp 98.3°F | Resp 14 | Ht 62.0 in | Wt 131.4 lb

## 2017-04-03 DIAGNOSIS — F329 Major depressive disorder, single episode, unspecified: Secondary | ICD-10-CM | POA: Diagnosis not present

## 2017-04-03 DIAGNOSIS — F32A Depression, unspecified: Secondary | ICD-10-CM

## 2017-04-03 DIAGNOSIS — F419 Anxiety disorder, unspecified: Secondary | ICD-10-CM | POA: Diagnosis not present

## 2017-04-03 DIAGNOSIS — Z202 Contact with and (suspected) exposure to infections with a predominantly sexual mode of transmission: Secondary | ICD-10-CM | POA: Diagnosis not present

## 2017-04-03 MED ORDER — ALPRAZOLAM 0.5 MG PO TABS: ORAL_TABLET | ORAL | 1 refills | Status: DC

## 2017-04-03 MED ORDER — FLUOXETINE HCL 20 MG PO TABS: 30.0000 mg | ORAL_TABLET | Freq: Every day | ORAL | 2 refills | Status: DC

## 2017-04-03 NOTE — Patient Instructions (Addendum)
GO TO THE LAB : Get the blood work    Increase fluoxetine to 1.5 tablets daily  We will see you in few weeks for your physical

## 2017-04-03 NOTE — Progress Notes (Signed)
Pre visit review using our clinic review tool, if applicable. No additional management support is needed unless otherwise documented below in the visit note. 
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
## 2017-04-05 NOTE — Assessment & Plan Note (Signed)
Anxiety, depression:  See last OV note, since then she started fluoxetine and continue with Xanax.  She is much improved emotionally, she also sees a therapist.  Had a very good week except for today she is a slightly anxious, increase SSRIs?Marland Kitchen. Her husband is moving out of the house, the family is now aware of the situation. We agreed to increase fluoxetine to 30 mg daily, refill Xanax.  Cont with psychotherapy. STD screening: Check HIV, RPR, hep C, hep B.  Will see gynecology soon, recommend to discuss G&C screening w/ them Follow-up scheduled already for January 2019.

## 2017-04-06 LAB — HEPATITIS B CORE ANTIBODY, TOTAL: Hep B Core Total Ab: NONREACTIVE

## 2017-04-06 LAB — HEPATITIS B SURFACE ANTIGEN: Hepatitis B Surface Ag: NONREACTIVE

## 2017-04-06 LAB — HEPATITIS C ANTIBODY
Hepatitis C Ab: NONREACTIVE
SIGNAL TO CUT-OFF: 0.01 (ref <1.00)

## 2017-04-06 LAB — RPR: RPR Ser Ql: NONREACTIVE

## 2017-04-06 LAB — HIV ANTIBODY (ROUTINE TESTING W REFLEX): HIV 1&2 Ab, 4th Generation: NONREACTIVE

## 2017-04-07 ENCOUNTER — Ambulatory Visit: Payer: BLUE CROSS/BLUE SHIELD | Admitting: Psychology

## 2017-04-23 ENCOUNTER — Other Ambulatory Visit: Payer: Self-pay

## 2017-04-23 MED ORDER — FLUOXETINE HCL 20 MG PO TABS: 30.0000 mg | ORAL_TABLET | Freq: Every day | ORAL | 0 refills | Status: DC

## 2017-05-15 ENCOUNTER — Encounter: Payer: Self-pay | Admitting: Internal Medicine

## 2017-05-15 ENCOUNTER — Telehealth: Payer: Self-pay

## 2017-05-15 ENCOUNTER — Other Ambulatory Visit: Payer: Self-pay

## 2017-05-15 MED ORDER — ALPRAZOLAM 0.5 MG PO TABS: ORAL_TABLET | ORAL | 0 refills | Status: DC

## 2017-05-15 MED ORDER — FLUOXETINE HCL 20 MG PO TABS: 30.0000 mg | ORAL_TABLET | Freq: Every day | ORAL | 1 refills | Status: DC

## 2017-05-15 NOTE — Telephone Encounter (Signed)
Pt is requesting refill on alprazolam (90 day supply if possible as it needs to go through [facility]).  Last OV: 04/03/2017, cpe 05/22/2017 Last Fill: 04/03/2017 #60 and 1RF UDS: None  Will need to get UDS and contract at appt on 05/22/2017   Please advise.

## 2017-05-15 NOTE — Telephone Encounter (Signed)
sent 

## 2017-05-22 ENCOUNTER — Encounter: Payer: Self-pay | Admitting: Internal Medicine

## 2017-05-22 ENCOUNTER — Ambulatory Visit (INDEPENDENT_AMBULATORY_CARE_PROVIDER_SITE_OTHER): Payer: BLUE CROSS/BLUE SHIELD | Admitting: Internal Medicine

## 2017-05-22 VITALS — BP 124/68 | HR 62 | Temp 98.2°F | Resp 14 | Ht 62.0 in | Wt 128.0 lb

## 2017-05-22 DIAGNOSIS — Z0000 Encounter for general adult medical examination without abnormal findings: Secondary | ICD-10-CM

## 2017-05-22 DIAGNOSIS — F329 Major depressive disorder, single episode, unspecified: Secondary | ICD-10-CM | POA: Diagnosis not present

## 2017-05-22 DIAGNOSIS — Z79899 Other long term (current) drug therapy: Secondary | ICD-10-CM

## 2017-05-22 DIAGNOSIS — F32A Depression, unspecified: Secondary | ICD-10-CM

## 2017-05-22 DIAGNOSIS — F419 Anxiety disorder, unspecified: Secondary | ICD-10-CM

## 2017-05-22 LAB — CBC
HCT: 42.3 % (ref 36.0–46.0)
Hemoglobin: 14.3 g/dL (ref 12.0–15.0)
MCHC: 33.7 g/dL (ref 30.0–36.0)
MCV: 100.8 fl — ABNORMAL HIGH (ref 78.0–100.0)
Platelets: 199 10*3/uL (ref 150.0–400.0)
RBC: 4.2 Mil/uL (ref 3.87–5.11)
RDW: 12.7 % (ref 11.5–15.5)
WBC: 6.4 10*3/uL (ref 4.0–10.5)

## 2017-05-22 LAB — LIPID PANEL
Cholesterol: 241 mg/dL — ABNORMAL HIGH (ref 0–200)
HDL: 98 mg/dL (ref >39.00)
LDL Cholesterol: 130 mg/dL — ABNORMAL HIGH (ref 0–99)
NonHDL: 143.04
Total CHOL/HDL Ratio: 2
Triglycerides: 63 mg/dL (ref 0.0–149.0)
VLDL: 12.6 mg/dL (ref 0.0–40.0)

## 2017-05-22 LAB — COMPREHENSIVE METABOLIC PANEL
ALT: 14 U/L (ref 0–35)
AST: 17 U/L (ref 0–37)
Albumin: 4.6 g/dL (ref 3.5–5.2)
Alkaline Phosphatase: 36 U/L — ABNORMAL LOW (ref 39–117)
BUN: 14 mg/dL (ref 6–23)
CO2: 31 mEq/L (ref 19–32)
Calcium: 9.1 mg/dL (ref 8.4–10.5)
Chloride: 100 mEq/L (ref 96–112)
Creatinine, Ser: 0.66 mg/dL (ref 0.40–1.20)
GFR: 97.42 mL/min (ref >60.00)
Glucose, Bld: 93 mg/dL (ref 70–99)
Potassium: 4 mEq/L (ref 3.5–5.1)
Sodium: 138 mEq/L (ref 135–145)
Total Bilirubin: 0.8 mg/dL (ref 0.2–1.2)
Total Protein: 6.7 g/dL (ref 6.0–8.3)

## 2017-05-22 LAB — TSH: TSH: 1.63 u[IU]/mL (ref 0.35–4.50)

## 2017-05-22 NOTE — Assessment & Plan Note (Signed)
Anxiety depression: Doing well on Prozac, still needs Xanax at night to help her sleep.  In general she feels well, occasional sad days, her husband has moved out and they are now legally separated. She is concerned about the crackling noise from the neck, stretching neck exercises discuss RTC 6 to 8  Months

## 2017-05-22 NOTE — Patient Instructions (Addendum)
GO TO THE LAB : Get the blood work     GO TO THE FRONT DESK Schedule your next appointment for a  checkup in 6-8 months    

## 2017-05-22 NOTE — Progress Notes (Signed)
Pre visit review using our clinic review tool, if applicable. No additional management support is needed unless otherwise documented below in the visit note. 

## 2017-05-22 NOTE — Assessment & Plan Note (Signed)
Td 2014-- had a local reaction; s/p shingrix x 2 at [facility];  declined a flu shot - Female care per gyn, OV pending for next week.  They do mammograms, she will inquired about doing a bone density test. -CCS: + FH of colon polyps, colonoscopy 10/2009, negative, next in 10 years - (+)FH CAD: will need a LDL close to 100  - (+) FH ovarian cancer, f/u per gyn  -labs: CMP, FLP, CBC, TSH, UDS -Diet and exercise discussed
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
## 2017-05-25 ENCOUNTER — Telehealth: Payer: Self-pay | Admitting: Internal Medicine

## 2017-05-25 NOTE — Telephone Encounter (Signed)
Copied from CRM [phone]#[id number]. Topic: Inquiry >> May 25, 2017 10:45 AM Stephannie LiSimmons, Rishik Tubby L, NT wrote: Reason for CRM: [facility] and has questions on the directions for alprazolam tabs please advise  [phone] ref. [id number]

## 2017-05-25 NOTE — Telephone Encounter (Signed)
Spoke w/ [facility]-directions on alprazolam confirmed.

## 2017-05-27 ENCOUNTER — Telehealth: Payer: Self-pay | Admitting: Internal Medicine

## 2017-05-27 LAB — PAIN MGMT, PROFILE 8 W/CONF, U
6 Acetylmorphine: NEGATIVE ng/mL (ref <10)
Alcohol Metabolites: POSITIVE ng/mL — AB (ref <500)
Alphahydroxyalprazolam: 86 ng/mL — ABNORMAL HIGH (ref <25)
Alphahydroxymidazolam: NEGATIVE ng/mL (ref <50)
Alphahydroxytriazolam: NEGATIVE ng/mL (ref <50)
Aminoclonazepam: NEGATIVE ng/mL (ref <25)
Amphetamines: NEGATIVE ng/mL (ref <500)
Benzodiazepines: POSITIVE ng/mL — AB (ref <100)
Buprenorphine, Urine: NEGATIVE ng/mL (ref <5)
Cocaine Metabolite: NEGATIVE ng/mL (ref <150)
Creatinine: 40.3 mg/dL
Ethyl Glucuronide (ETG): 10075 ng/mL — ABNORMAL HIGH (ref <500)
Ethyl Sulfate (ETS): 3247 ng/mL — ABNORMAL HIGH (ref <100)
Hydroxyethylflurazepam: NEGATIVE ng/mL (ref <50)
Lorazepam: NEGATIVE ng/mL (ref <50)
MDMA: NEGATIVE ng/mL (ref <500)
Marijuana Metabolite: NEGATIVE ng/mL (ref <20)
Nordiazepam: NEGATIVE ng/mL (ref <50)
Opiates: NEGATIVE ng/mL (ref <100)
Oxazepam: NEGATIVE ng/mL (ref <50)
Oxidant: NEGATIVE ug/mL (ref <200)
Oxycodone: NEGATIVE ng/mL (ref <100)
Temazepam: NEGATIVE ng/mL (ref <50)
pH: 7.71 (ref 4.5–9.0)

## 2017-05-27 NOTE — Telephone Encounter (Signed)
I spoke w/ [facility] 05/25/2017 they were needing clarification on sig for xanax. See telephone note from 05/25/2017.

## 2017-05-27 NOTE — Telephone Encounter (Signed)
Copied from CRM [id number]. Topic: Quick Communication - Rx Refill/Question >> May 27, 2017  3:01 PM Alexander BergeronBarksdale, Harvey B wrote: Medication: Prudy FeelerXANAX   Preferred Pharmacy (with phone number or street name): [facility]   Pt states the pharmacy is trying to fill the Rx and pt is trying to check the status of the medication, pt states that she has 5 pills left, contact pharmacy or pt if needed

## 2017-05-29 ENCOUNTER — Encounter: Payer: Self-pay | Admitting: Internal Medicine

## 2017-05-29 LAB — HM MAMMOGRAPHY

## 2017-05-29 LAB — HM DEXA SCAN

## 2017-05-29 LAB — HM PAP SMEAR

## 2017-06-26 ENCOUNTER — Ambulatory Visit: Payer: Self-pay | Admitting: *Deleted

## 2017-06-26 NOTE — Telephone Encounter (Signed)
   Reason for Disposition . Cough  Answer Assessment - Initial Assessment Questions 1. ONSET: "When did the cough begin?"       1 week ago 2. SEVERITY: "How bad is the cough today?"      Moderate 3. RESPIRATORY DISTRESS: "Describe your breathing."      No 4. FEVER: "Do you have a fever?" If so, ask: "What is your temperature, how was it measured, and when did it start?"     Hot last night, not sure of fever 5. SPUTUM: "Describe the color of your sputum" (clear, white, yellow, green)     Not sure 6. HEMOPTYSIS: "Are you coughing up any blood?" If so ask: "How much?" (flecks, streaks, tablespoons, etc.)     No 7. CARDIAC HISTORY: "Do you have any history of heart disease?" (e.g., heart attack, congestive heart failure)      no 8. LUNG HISTORY: "Do you have any history of lung disease?"  (e.g., pulmonary embolus, asthma, emphysema)     no 9. PE RISK FACTORS: "Do you have a history of blood clots?" (or: recent major surgery, recent prolonged travel, bedridden )     no 10. OTHER SYMPTOMS: "Do you have any other symptoms?" (e.g., runny nose, wheezing, chest pain)       Bruising easily, especially arms, shaky, lightheadedness,(comes and goes) nausea along with dizziness, weight loss 15 lbs since November.  11. PREGNANCY: "Is there any chance you are pregnant?" "When was your last menstrual period?"       No 12. TRAVEL: "Have you traveled out of the country in the last month?" (e.g., travel history, exposures)       No  Protocols used: COUGH - ACUTE PRODUCTIVE-A-AH

## 2017-06-29 ENCOUNTER — Ambulatory Visit: Payer: Self-pay | Admitting: Internal Medicine

## 2017-07-08 ENCOUNTER — Encounter: Payer: Self-pay | Admitting: Internal Medicine

## 2017-07-08 ENCOUNTER — Ambulatory Visit (INDEPENDENT_AMBULATORY_CARE_PROVIDER_SITE_OTHER): Payer: BLUE CROSS/BLUE SHIELD | Admitting: Internal Medicine

## 2017-07-08 VITALS — BP 126/68 | HR 85 | Temp 98.2°F | Resp 14 | Ht 62.0 in | Wt 127.5 lb

## 2017-07-08 DIAGNOSIS — F419 Anxiety disorder, unspecified: Secondary | ICD-10-CM | POA: Diagnosis not present

## 2017-07-08 DIAGNOSIS — J069 Acute upper respiratory infection, unspecified: Secondary | ICD-10-CM | POA: Diagnosis not present

## 2017-07-08 DIAGNOSIS — F329 Major depressive disorder, single episode, unspecified: Secondary | ICD-10-CM

## 2017-07-08 DIAGNOSIS — F32A Depression, unspecified: Secondary | ICD-10-CM

## 2017-07-08 NOTE — Patient Instructions (Addendum)
Rest, fluids , tylenol  For cough:  Take Mucinex DM twice a day as needed until better  Use OTC Nasocort or Flonase : 2 nasal sprays on each side of the nose in the morning until you feel better   Call if not gradually better over the next  10 days  Call anytime if the symptoms are severe   Ok Claritin D occasionally
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
## 2017-07-08 NOTE — Progress Notes (Signed)
Pre visit review using our clinic review tool, if applicable. No additional management support is needed unless otherwise documented below in the visit note. 

## 2017-07-09 NOTE — Assessment & Plan Note (Signed)
URI: Mild cough for 2 weeks, left ear pain for 1 day.  Suspect is a URI, despite the ear pain, there is no evidence of middle ear or external ear infection. Rx conservative treatment for now, see instructions, call if not better. Anxiety, depression: Relatively well controlled, has good days and bad days.

## 2017-10-31 ENCOUNTER — Encounter: Payer: Self-pay | Admitting: Internal Medicine

## 2017-10-31 ENCOUNTER — Telehealth: Payer: Self-pay | Admitting: Internal Medicine

## 2017-11-02 MED ORDER — FLUOXETINE HCL 20 MG PO TABS: 30.0000 mg | ORAL_TABLET | Freq: Every day | ORAL | 1 refills | Status: DC

## 2017-11-02 MED ORDER — ALPRAZOLAM 0.5 MG PO TABS: ORAL_TABLET | ORAL | 0 refills | Status: DC

## 2017-11-02 NOTE — Telephone Encounter (Signed)
Sent!

## 2017-11-02 NOTE — Telephone Encounter (Signed)
Pt is requesting refill on alprazolam.   Last OV: 07/08/2017 Last Fill: 05/15/2017 #180 and 0RF (Pt sig: 1/2 tab prn for anxiety, and 1-2 tab qhs prn) UDS: 05/22/2017 Low risk  NCCR printed- no discrepancies noted- sent for scanning  Please advise.

## 2017-12-22 ENCOUNTER — Encounter: Payer: Self-pay | Admitting: Internal Medicine

## 2018-03-02 ENCOUNTER — Encounter: Payer: Self-pay | Admitting: Internal Medicine

## 2018-03-02 ENCOUNTER — Ambulatory Visit (HOSPITAL_BASED_OUTPATIENT_CLINIC_OR_DEPARTMENT_OTHER)
Admission: RE | Admit: 2018-03-02 | Discharge: 2018-03-02 | Disposition: A | Discharge status: Home / Self Care | Payer: BLUE CROSS/BLUE SHIELD | Source: Ambulatory Visit | Attending: Internal Medicine | Admitting: Internal Medicine

## 2018-03-02 ENCOUNTER — Ambulatory Visit (INDEPENDENT_AMBULATORY_CARE_PROVIDER_SITE_OTHER): Payer: BLUE CROSS/BLUE SHIELD | Admitting: Internal Medicine

## 2018-03-02 VITALS — BP 116/64 | HR 80 | Temp 98.6°F | Resp 16 | Ht 62.0 in | Wt 124.5 lb

## 2018-03-02 DIAGNOSIS — F419 Anxiety disorder, unspecified: Secondary | ICD-10-CM | POA: Diagnosis not present

## 2018-03-02 DIAGNOSIS — R634 Abnormal weight loss: Secondary | ICD-10-CM

## 2018-03-02 DIAGNOSIS — F32A Depression, unspecified: Secondary | ICD-10-CM

## 2018-03-02 DIAGNOSIS — F329 Major depressive disorder, single episode, unspecified: Secondary | ICD-10-CM

## 2018-03-02 NOTE — Progress Notes (Signed)
Pre visit review using our clinic review tool, if applicable. No additional management support is needed unless otherwise documented below in the visit note. 
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
## 2018-03-02 NOTE — Patient Instructions (Signed)
GO TO THE LAB : Get the blood work     GO TO THE FRONT DESK Schedule your next appointment for a  Check up in 6 weeks  Get your XR

## 2018-03-03 LAB — CBC (INCLUDES DIFF/PLT) WITH PATHOLOGIST REVIEW
Basophils Absolute: 33 cells/uL (ref 0–200)
Basophils Relative: 0.5 %
Eosinophils Absolute: 59 cells/uL (ref 15–500)
Eosinophils Relative: 0.9 %
HCT: 38.6 % (ref 35.0–45.0)
Hemoglobin: 13.2 g/dL (ref 11.7–15.5)
Lymphs Abs: 1931 cells/uL (ref 850–3900)
MCH: 34.6 pg — ABNORMAL HIGH (ref 27.0–33.0)
MCHC: 34.2 g/dL (ref 32.0–36.0)
MCV: 101 fL — ABNORMAL HIGH (ref 80.0–100.0)
MPV: 9.8 fL (ref 7.5–12.5)
Monocytes Relative: 9.7 %
Neutro Abs: 3848 cells/uL (ref 1500–7800)
Neutrophils Relative %: 59.2 %
Platelets: 183 10*3/uL (ref 140–400)
RBC: 3.82 10*6/uL (ref 3.80–5.10)
RDW: 11.8 % (ref 11.0–15.0)
Total Lymphocyte: 29.7 %
WBC mixed population: 631 cells/uL (ref 200–950)
WBC: 6.5 10*3/uL (ref 3.8–10.8)

## 2018-03-03 LAB — COMPREHENSIVE METABOLIC PANEL
ALT: 14 U/L (ref 0–35)
AST: 15 U/L (ref 0–37)
Albumin: 4.6 g/dL (ref 3.5–5.2)
Alkaline Phosphatase: 33 U/L — ABNORMAL LOW (ref 39–117)
BUN: 18 mg/dL (ref 6–23)
CO2: 29 mEq/L (ref 19–32)
Calcium: 9.1 mg/dL (ref 8.4–10.5)
Chloride: 101 mEq/L (ref 96–112)
Creatinine, Ser: 0.83 mg/dL (ref 0.40–1.20)
GFR: 74.58 mL/min (ref >60.00)
Glucose, Bld: 93 mg/dL (ref 70–99)
Potassium: 4 mEq/L (ref 3.5–5.1)
Sodium: 139 mEq/L (ref 135–145)
Total Bilirubin: 0.5 mg/dL (ref 0.2–1.2)
Total Protein: 6.5 g/dL (ref 6.0–8.3)

## 2018-03-03 LAB — URINALYSIS, ROUTINE W REFLEX MICROSCOPIC
Bilirubin Urine: NEGATIVE
Ketones, ur: NEGATIVE
Nitrite: NEGATIVE
Specific Gravity, Urine: 1.01 (ref 1.000–1.030)
Total Protein, Urine: NEGATIVE
Urine Glucose: NEGATIVE
Urobilinogen, UA: 0.2 (ref 0.0–1.0)
pH: 7 (ref 5.0–8.0)

## 2018-03-03 LAB — TSH: TSH: 3.54 u[IU]/mL (ref 0.35–4.50)

## 2018-03-03 LAB — SEDIMENTATION RATE: Sed Rate: 3 mm/hr (ref 0–30)

## 2018-03-03 MED ORDER — ALPRAZOLAM 0.5 MG PO TABS: ORAL_TABLET | ORAL | 0 refills | Status: DC

## 2018-03-03 NOTE — Telephone Encounter (Signed)
Pt is requesting refill on alprazolam.   Last OV: 03/03/2018 Last Fill: 11/02/2017 #180 and 0RF Pt sig: 1/2 tablet by mouth daily prn for anxiety; 1-2 tablets qhs prn for insomnia UDS: 05/22/2017 Low risk  NCCR printed- no discrepancies noted- sent for scanning

## 2018-03-03 NOTE — Assessment & Plan Note (Signed)
Weight loss: Gradual weight loss, approximately 17 pounds in 11 months in the context of anxiety and depression due to marital issues, going through a separation. Review of systems is positive for easy bruising, decreased appetite. Physical exam is benign. Plan: CMP, CBC, TSH, peripheral blood smear, UA, chest x-ray and sed rate.  Reassess in 6 weeks. Anxiety depression: Having more good days than bad days, currently taking Prozac, Xanax.  No suicidal ideas.  Declined need to increase the medication at this point.  I encouraged her to continue counseling, also explained the benefits of yoga, exercise, etc. Declined a flu shot RTC 6 weeks

## 2018-03-05 NOTE — Telephone Encounter (Signed)
Refill sent. Please ask pt to return for b12 and folate level, dx macrocytosis since lab cannot add on.

## 2018-03-05 NOTE — Telephone Encounter (Signed)
Please advise pt that her lab work suggests possible b12 or folate deficiency. Thyroid, kidney function, electrolytes look good. Urine shows possible UTI. Does she have any symptoms or UTI (burning/frequency?) if so, let me know and we can send antibiotic. If no symptoms, no need to treat.

## 2018-04-16 ENCOUNTER — Encounter: Payer: Self-pay | Admitting: Internal Medicine

## 2018-04-16 ENCOUNTER — Ambulatory Visit (INDEPENDENT_AMBULATORY_CARE_PROVIDER_SITE_OTHER): Payer: BLUE CROSS/BLUE SHIELD | Admitting: Internal Medicine

## 2018-04-16 VITALS — BP 116/78 | HR 73 | Temp 97.9°F | Resp 16 | Ht 62.0 in | Wt 123.4 lb

## 2018-04-16 DIAGNOSIS — F329 Major depressive disorder, single episode, unspecified: Secondary | ICD-10-CM

## 2018-04-16 DIAGNOSIS — Z79899 Other long term (current) drug therapy: Secondary | ICD-10-CM | POA: Diagnosis not present

## 2018-04-16 DIAGNOSIS — F419 Anxiety disorder, unspecified: Secondary | ICD-10-CM

## 2018-04-16 DIAGNOSIS — F32A Depression, unspecified: Secondary | ICD-10-CM
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
## 2018-04-16 NOTE — Patient Instructions (Addendum)
GO TO THE LAB : Provide a urine sample  GO TO THE FRONT DESK Schedule your next appointment for a physical (January)

## 2018-04-16 NOTE — Progress Notes (Signed)
Pre visit review using our clinic review tool, if applicable. No additional management support is needed unless otherwise documented below in the visit note. 

## 2018-04-17 NOTE — Assessment & Plan Note (Signed)
Weight loss: Weight has stabilized, work-up was essentially negative. Anxiety, depression: Today, she reports more good days.  PHQ 9 scored 4.  Continue with fluoxetine and Xanax.  UDS and contract today. Counseled and listening therapy provided  RTC scheduled for January

## 2018-04-19 LAB — PAIN MGMT, PROFILE 8 W/CONF, U
6 Acetylmorphine: NEGATIVE ng/mL (ref <10)
Alcohol Metabolites: POSITIVE ng/mL — AB (ref <500)
Alphahydroxyalprazolam: 39 ng/mL — ABNORMAL HIGH (ref <25)
Alphahydroxymidazolam: NEGATIVE ng/mL (ref <50)
Alphahydroxytriazolam: NEGATIVE ng/mL (ref <50)
Aminoclonazepam: NEGATIVE ng/mL (ref <25)
Amphetamines: NEGATIVE ng/mL (ref <500)
Benzodiazepines: POSITIVE ng/mL — AB (ref <100)
Buprenorphine, Urine: NEGATIVE ng/mL (ref <5)
Cocaine Metabolite: NEGATIVE ng/mL (ref <150)
Creatinine: 42.5 mg/dL
Ethyl Glucuronide (ETG): 3759 ng/mL — ABNORMAL HIGH (ref <500)
Ethyl Sulfate (ETS): 891 ng/mL — ABNORMAL HIGH (ref <100)
Hydroxyethylflurazepam: NEGATIVE ng/mL (ref <50)
Lorazepam: NEGATIVE ng/mL (ref <50)
MDMA: NEGATIVE ng/mL (ref <500)
Marijuana Metabolite: NEGATIVE ng/mL (ref <20)
Nordiazepam: NEGATIVE ng/mL (ref <50)
Opiates: NEGATIVE ng/mL (ref <100)
Oxazepam: NEGATIVE ng/mL (ref <50)
Oxidant: NEGATIVE ug/mL (ref <200)
Oxycodone: NEGATIVE ng/mL (ref <100)
Temazepam: NEGATIVE ng/mL (ref <50)
pH: 7.05 (ref 4.5–9.0)

## 2018-05-02 ENCOUNTER — Other Ambulatory Visit: Payer: Self-pay | Admitting: Internal Medicine

## 2018-05-28 ENCOUNTER — Telehealth: Payer: Self-pay

## 2018-05-28 ENCOUNTER — Encounter: Payer: Self-pay | Admitting: Internal Medicine

## 2018-05-28 ENCOUNTER — Ambulatory Visit (INDEPENDENT_AMBULATORY_CARE_PROVIDER_SITE_OTHER): Payer: BC Managed Care – PPO | Admitting: Internal Medicine

## 2018-05-28 VITALS — BP 120/78 | HR 74 | Temp 98.1°F | Resp 16 | Ht 62.0 in | Wt 124.5 lb

## 2018-05-28 DIAGNOSIS — Z0000 Encounter for general adult medical examination without abnormal findings: Secondary | ICD-10-CM

## 2018-05-28 DIAGNOSIS — E785 Hyperlipidemia, unspecified: Secondary | ICD-10-CM

## 2018-05-28 DIAGNOSIS — R1032 Left lower quadrant pain: Secondary | ICD-10-CM | POA: Diagnosis not present

## 2018-05-28 DIAGNOSIS — E875 Hyperkalemia: Secondary | ICD-10-CM | POA: Diagnosis not present

## 2018-05-28 LAB — CBC WITH DIFFERENTIAL/PLATELET
Basophils Absolute: 0 10*3/uL (ref 0.0–0.1)
Basophils Relative: 0.8 % (ref 0.0–3.0)
Eosinophils Absolute: 0.1 10*3/uL (ref 0.0–0.7)
Eosinophils Relative: 1.3 % (ref 0.0–5.0)
HCT: 41.6 % (ref 36.0–46.0)
Hemoglobin: 14.1 g/dL (ref 12.0–15.0)
Lymphocytes Relative: 25.8 % (ref 12.0–46.0)
Lymphs Abs: 1.3 10*3/uL (ref 0.7–4.0)
MCHC: 33.9 g/dL (ref 30.0–36.0)
MCV: 102 fl — ABNORMAL HIGH (ref 78.0–100.0)
Monocytes Absolute: 0.6 10*3/uL (ref 0.1–1.0)
Monocytes Relative: 11.2 % (ref 3.0–12.0)
Neutro Abs: 3 10*3/uL (ref 1.4–7.7)
Neutrophils Relative %: 60.9 % (ref 43.0–77.0)
Platelets: 199 10*3/uL (ref 150.0–400.0)
RBC: 4.08 Mil/uL (ref 3.87–5.11)
RDW: 12.6 % (ref 11.5–15.5)
WBC: 5 10*3/uL (ref 4.0–10.5)

## 2018-05-28 LAB — COMPREHENSIVE METABOLIC PANEL
ALT: 14 U/L (ref 0–35)
AST: 15 U/L (ref 0–37)
Albumin: 4.6 g/dL (ref 3.5–5.2)
Alkaline Phosphatase: 34 U/L — ABNORMAL LOW (ref 39–117)
BUN: 16 mg/dL (ref 6–23)
CO2: 33 mEq/L — ABNORMAL HIGH (ref 19–32)
Calcium: 9.8 mg/dL (ref 8.4–10.5)
Chloride: 101 mEq/L (ref 96–112)
Creatinine, Ser: 0.78 mg/dL (ref 0.40–1.20)
GFR: 75.33 mL/min (ref >60.00)
Glucose, Bld: 70 mg/dL (ref 70–99)
Potassium: 5.6 mEq/L — ABNORMAL HIGH (ref 3.5–5.1)
Sodium: 140 mEq/L (ref 135–145)
Total Bilirubin: 0.6 mg/dL (ref 0.2–1.2)
Total Protein: 6.6 g/dL (ref 6.0–8.3)

## 2018-05-28 LAB — URINALYSIS, ROUTINE W REFLEX MICROSCOPIC
Bilirubin Urine: NEGATIVE
Ketones, ur: NEGATIVE
Leukocytes, UA: NEGATIVE
Nitrite: NEGATIVE
Specific Gravity, Urine: <=1.005 — AB (ref 1.000–1.030)
Total Protein, Urine: NEGATIVE
Urine Glucose: NEGATIVE
Urobilinogen, UA: 0.2 (ref 0.0–1.0)
pH: 6.5 (ref 5.0–8.0)

## 2018-05-28 LAB — LIPID PANEL
Cholesterol: 266 mg/dL — ABNORMAL HIGH (ref 0–200)
HDL: 107.2 mg/dL (ref >39.00)
LDL Cholesterol: 143 mg/dL — ABNORMAL HIGH (ref 0–99)
NonHDL: 158.34
Total CHOL/HDL Ratio: 2
Triglycerides: 76 mg/dL (ref 0.0–149.0)
VLDL: 15.2 mg/dL (ref 0.0–40.0)

## 2018-05-28 MED ORDER — ATORVASTATIN CALCIUM 20 MG PO TABS: 20.0000 mg | ORAL_TABLET | Freq: Every day | ORAL | 2 refills | Status: DC

## 2018-05-28 NOTE — Progress Notes (Signed)
Pre visit review using our clinic review tool, if applicable. No additional management support is needed unless otherwise documented below in the visit note. 

## 2018-05-28 NOTE — Patient Instructions (Signed)
GO TO THE LAB : Get the blood work     GO TO THE FRONT DESK Schedule your next appointment for a checkup in 6 months  Please see your gynecologist within the next couple of weeks  If the pain at the left abdomen is not gradually better or if you have any alarming symptoms such as fever, chills, increased pain: Please call us.

## 2018-05-28 NOTE — Telephone Encounter (Signed)
Lipitor 20 mg 1 p.o. nightly #30 and 2 refills. Schedule labs 6 weeks from today, FLP, AST, ALT.  High cholesterol.

## 2018-05-28 NOTE — Assessment & Plan Note (Addendum)
Td 2014-- had a local reaction; s/p shingrix x 2 at [facility];  declined a flu shot (h/o s/e pain-swelling) - Female care per gyn, OV pending.  MMG 05-2017; had a DEXA 05/2017: wnl -CCS: + FH of colon polyps, colonoscopy 10/2009, negative, next in 10 years - (+)FH CAD: will need a LDL close to 100  - (+) FH ovarian cancer, f/u per gyn  -labs: CMP, FLP, CBC, UA, urine culture -Diet and exercise discussed
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
## 2018-05-28 NOTE — Telephone Encounter (Signed)
Copied from CRM [id number]. Topic: General - Other >> May 28, 2018  1:47 PM Jaquita Rector A wrote: Reason for CRM: Patient called to say that she looked at her lab results on My Chart and is willing to start taking medication suggested for high Cholesterol. Ph# [phone]

## 2018-05-28 NOTE — Telephone Encounter (Signed)
Rx sent. Labs ordered. MyChart message sent to Pt informing to call office to schedule lab appt.

## 2018-05-29 ENCOUNTER — Encounter: Payer: Self-pay | Admitting: Internal Medicine

## 2018-05-29 NOTE — Assessment & Plan Note (Signed)
Here for CPX Anxiety depression: Currently well controlled LLQ abdominal pain: GI review of systems is essentially negative, on exam other than mild tenderness abdomen is benign.  We will get a CBC, UA urine culture.  She is due to see gynecology and has a family history of ovarian cancer.  I asked her to see her gynecologist within the next couple of weeks, call me anyway if she is not improving.  She verbalized understanding. RTC routine visit in 6 months.

## 2018-05-30 LAB — URINE CULTURE
MICRO NUMBER:: 101197
Result:: NO GROWTH
SPECIMEN QUALITY:: ADEQUATE

## 2018-06-17 ENCOUNTER — Encounter: Payer: Self-pay | Admitting: Internal Medicine

## 2018-07-16 ENCOUNTER — Other Ambulatory Visit (INDEPENDENT_AMBULATORY_CARE_PROVIDER_SITE_OTHER): Payer: BC Managed Care – PPO

## 2018-07-16 ENCOUNTER — Other Ambulatory Visit: Payer: Self-pay

## 2018-07-16 DIAGNOSIS — E785 Hyperlipidemia, unspecified: Secondary | ICD-10-CM | POA: Diagnosis not present

## 2018-07-16 DIAGNOSIS — E875 Hyperkalemia: Secondary | ICD-10-CM | POA: Diagnosis not present

## 2018-07-16 LAB — LIPID PANEL
Cholesterol: 172 mg/dL (ref 0–200)
HDL: 95.7 mg/dL (ref >39.00)
LDL Cholesterol: 64 mg/dL (ref 0–99)
NonHDL: 76.66
Total CHOL/HDL Ratio: 2
Triglycerides: 63 mg/dL (ref 0.0–149.0)
VLDL: 12.6 mg/dL (ref 0.0–40.0)

## 2018-07-16 LAB — BASIC METABOLIC PANEL
BUN: 19 mg/dL (ref 6–23)
CO2: 31 mEq/L (ref 19–32)
Calcium: 8.8 mg/dL (ref 8.4–10.5)
Chloride: 101 mEq/L (ref 96–112)
Creatinine, Ser: 0.79 mg/dL (ref 0.40–1.20)
GFR: 74.2 mL/min (ref >60.00)
Glucose, Bld: 82 mg/dL (ref 70–99)
Potassium: 4.5 mEq/L (ref 3.5–5.1)
Sodium: 138 mEq/L (ref 135–145)

## 2018-07-16 LAB — ALT: ALT: 23 U/L (ref 0–35)

## 2018-07-16 LAB — AST: AST: 20 U/L (ref 0–37)

## 2018-07-19 MED ORDER — ATORVASTATIN CALCIUM 20 MG PO TABS: 20.0000 mg | ORAL_TABLET | Freq: Every day | ORAL | 5 refills | Status: DC

## 2018-07-19 NOTE — Addendum Note (Signed)
Addended byConrad Cadiz D on: 07/19/2018 01:31 PM   Modules accepted: Orders

## 2018-08-05 ENCOUNTER — Telehealth: Payer: Self-pay | Admitting: Internal Medicine

## 2018-08-06 NOTE — Telephone Encounter (Signed)
Pt is requesting refill on alprazolam.   Last OV: 05/28/2018 Last Fill: 03/03/2018 #180 and 0RF UDS: 04/16/2018 Low risk

## 2018-08-06 NOTE — Telephone Encounter (Signed)
Sent!

## 2018-09-09 ENCOUNTER — Encounter: Payer: Self-pay | Admitting: Internal Medicine

## 2018-10-05 ENCOUNTER — Other Ambulatory Visit: Payer: Self-pay | Admitting: Internal Medicine

## 2018-11-24 LAB — HM MAMMOGRAPHY

## 2018-11-26 ENCOUNTER — Other Ambulatory Visit: Payer: Self-pay

## 2018-11-26 ENCOUNTER — Ambulatory Visit: Payer: BC Managed Care – PPO | Admitting: Internal Medicine

## 2018-11-26 ENCOUNTER — Encounter: Payer: Self-pay | Admitting: Internal Medicine

## 2018-11-26 VITALS — BP 119/74 | HR 73 | Temp 98.5°F | Resp 16 | Ht 62.0 in | Wt 125.2 lb

## 2018-11-26 DIAGNOSIS — T148XXA Other injury of unspecified body region, initial encounter: Secondary | ICD-10-CM | POA: Diagnosis not present

## 2018-11-26 DIAGNOSIS — E785 Hyperlipidemia, unspecified: Secondary | ICD-10-CM

## 2018-11-26 LAB — CBC WITH DIFFERENTIAL/PLATELET
Basophils Absolute: 0 10*3/uL (ref 0.0–0.1)
Basophils Relative: 0.5 % (ref 0.0–3.0)
Eosinophils Absolute: 0 10*3/uL (ref 0.0–0.7)
Eosinophils Relative: 1 % (ref 0.0–5.0)
HCT: 39.6 % (ref 36.0–46.0)
Hemoglobin: 13.4 g/dL (ref 12.0–15.0)
Lymphocytes Relative: 32.3 % (ref 12.0–46.0)
Lymphs Abs: 1.4 10*3/uL (ref 0.7–4.0)
MCHC: 33.8 g/dL (ref 30.0–36.0)
MCV: 102.5 fl — ABNORMAL HIGH (ref 78.0–100.0)
Monocytes Absolute: 0.5 10*3/uL (ref 0.1–1.0)
Monocytes Relative: 12.3 % — ABNORMAL HIGH (ref 3.0–12.0)
Neutro Abs: 2.4 10*3/uL (ref 1.4–7.7)
Neutrophils Relative %: 53.9 % (ref 43.0–77.0)
Platelets: 174 10*3/uL (ref 150.0–400.0)
RBC: 3.86 Mil/uL — ABNORMAL LOW (ref 3.87–5.11)
RDW: 12.9 % (ref 11.5–15.5)
WBC: 4.4 10*3/uL (ref 4.0–10.5)

## 2018-11-26 LAB — PROTIME-INR
INR: 1 ratio (ref 0.8–1.0)
Prothrombin Time: 11.9 s (ref 9.6–13.1)

## 2018-11-26 LAB — APTT: aPTT: 32.1 s (ref 23.4–32.7)

## 2018-11-26 NOTE — Progress Notes (Signed)
Pre visit review using our clinic review tool, if applicable. No additional management support is needed unless otherwise documented below in the visit note. 
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
## 2018-11-26 NOTE — Patient Instructions (Signed)
GO TO THE LAB : Get the blood work     GO TO THE FRONT DESK Schedule your next appointment for a physical exam in 1- 2021   Apply over-the-counter antibiotic ointment to the right groin

## 2018-11-28 DIAGNOSIS — E785 Hyperlipidemia, unspecified: Secondary | ICD-10-CM | POA: Insufficient documentation

## 2018-11-28 NOTE — Assessment & Plan Note (Signed)
Hyperlipidemia: Started Lipitor few months ago, good compliance, good response.  No change Easy bruising: check a CBC, PT, PTT.  On exam today there is no hematoma or ecchymosis that I can tell.  No lymphadenopathies. LLQ abdominal pain: See last visit.  Resolved Tick bite?  I see a very small pustule, recommend OTC antibiotic ointment. Flu shot recommended, RTC CPX 05-2019

## 2018-11-29 ENCOUNTER — Other Ambulatory Visit: Payer: Self-pay | Admitting: Internal Medicine

## 2018-11-29 ENCOUNTER — Encounter: Payer: Self-pay | Admitting: Internal Medicine

## 2018-11-29 MED ORDER — FLUOXETINE HCL 20 MG PO TABS: 30.0000 mg | ORAL_TABLET | Freq: Every day | ORAL | 1 refills | Status: DC

## 2018-11-29 MED FILL — FLUOXETINE HCL 20 MG TABS: 20 | 90 days supply | Qty: 135 | Fill #0

## 2018-12-28 ENCOUNTER — Other Ambulatory Visit: Payer: Self-pay | Admitting: Internal Medicine

## 2019-01-11 ENCOUNTER — Encounter: Payer: Self-pay | Admitting: Internal Medicine

## 2019-01-11 ENCOUNTER — Ambulatory Visit: Payer: BC Managed Care – PPO | Admitting: Internal Medicine

## 2019-01-11 ENCOUNTER — Other Ambulatory Visit: Payer: Self-pay

## 2019-01-11 VITALS — BP 137/61 | HR 60 | Temp 97.5°F | Resp 16 | Ht 62.0 in | Wt 119.1 lb

## 2019-01-11 DIAGNOSIS — W57XXXA Bitten or stung by nonvenomous insect and other nonvenomous arthropods, initial encounter: Secondary | ICD-10-CM | POA: Diagnosis not present

## 2019-01-11 DIAGNOSIS — S40861A Insect bite (nonvenomous) of right upper arm, initial encounter: Secondary | ICD-10-CM

## 2019-01-11 MED ORDER — BETAMETHASONE DIPROPIONATE AUG 0.05 % EX CREA: TOPICAL_CREAM | Freq: Two times a day (BID) | CUTANEOUS | 0 refills | Status: DC

## 2019-01-11 MED ORDER — PREDNISONE 10 MG PO TABS: ORAL_TABLET | ORAL | 0 refills | Status: DC

## 2019-01-11 NOTE — Patient Instructions (Signed)
Take prednisone by mouth for 5 days  Applying the ointment twice a day for 5 days  If you are no better, the redness keeps spreading, you have fever chills: Please call the office or send a message.

## 2019-01-11 NOTE — Progress Notes (Signed)
Pre visit review using our clinic review tool, if applicable. No additional management support is needed unless otherwise documented below in the visit note. 
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
## 2019-01-12 NOTE — Assessment & Plan Note (Signed)
Insect bite: As described above, has an allergic reaction, doubt cellulitis. Plan: Low dose of prednisone for 5 days, topical steroids, call if not improving.  Keflex?

## 2019-02-06 ENCOUNTER — Encounter: Payer: Self-pay | Admitting: Internal Medicine

## 2019-02-11 ENCOUNTER — Encounter: Payer: Self-pay | Admitting: Internal Medicine

## 2019-02-17 ENCOUNTER — Telehealth: Payer: Self-pay | Admitting: Internal Medicine

## 2019-02-18 ENCOUNTER — Encounter: Payer: Self-pay | Admitting: Internal Medicine

## 2019-02-18 MED ORDER — ALPRAZOLAM 0.5 MG PO TABS: ORAL_TABLET | ORAL | 0 refills | Status: DC

## 2019-02-18 MED FILL — ALPRAZolam 0.5 MG TABS: 0.5 | 72 days supply | Qty: 180 | Fill #0

## 2019-02-18 NOTE — Telephone Encounter (Signed)
Please advise 

## 2019-02-18 NOTE — Telephone Encounter (Signed)
Pt stated that her normal pharmacy [facility] does not have alprazolam in stock. The downstairs pharmacy does. Pt would like to have rx transferred downstairs to outpatient pharmacy as she will be out of medication this weekend. Please advise.   ALPRAZolam Duanne Moron) 0.5 MG tablet  [place], [place] - [place] [phone] (Phone) [phone] (Fax)

## 2019-02-18 NOTE — Addendum Note (Signed)
Addended by: Kathlene November E on: 02/18/2019 03:45 PM   Modules accepted: Orders

## 2019-02-18 NOTE — Telephone Encounter (Signed)
Sent to our pharmacy, let the patient know on

## 2019-02-18 NOTE — Telephone Encounter (Signed)
Sent!

## 2019-02-18 NOTE — Telephone Encounter (Signed)
Mychart message sent to Pt.

## 2019-02-18 NOTE — Telephone Encounter (Signed)
Alprazolam refill.   Last OV: 01/11/2019 Last Fill: 08/06/2018 #180 and 1RF UDS: 05/22/2017 Low risk

## 2019-02-22 ENCOUNTER — Telehealth: Payer: Self-pay | Admitting: *Deleted

## 2019-02-22 NOTE — Telephone Encounter (Signed)
We already sent a prescription to the med center downstairs.

## 2019-02-22 NOTE — Telephone Encounter (Signed)
[facility] [address] sent fax stating that alprazolam 0.5mg  tabs are on backorder until November, would you like to change to different strength?

## 2019-05-06 HISTORY — PX: COLONOSCOPY: SHX174

## 2019-05-14 ENCOUNTER — Other Ambulatory Visit: Payer: Self-pay | Admitting: Internal Medicine

## 2019-05-15 ENCOUNTER — Encounter: Payer: Self-pay | Admitting: Internal Medicine

## 2019-05-16 ENCOUNTER — Other Ambulatory Visit: Payer: Self-pay | Admitting: Internal Medicine

## 2019-05-16 MED ORDER — ALPRAZOLAM 0.5 MG PO TABS: ORAL_TABLET | ORAL | 0 refills | Status: DC

## 2019-05-25 ENCOUNTER — Other Ambulatory Visit: Payer: Self-pay

## 2019-05-26 ENCOUNTER — Ambulatory Visit: Payer: BC Managed Care – PPO | Admitting: Internal Medicine

## 2019-05-26 ENCOUNTER — Encounter: Payer: Self-pay | Admitting: Internal Medicine

## 2019-05-26 VITALS — BP 111/84 | HR 72 | Temp 96.7°F | Resp 16 | Ht 62.0 in | Wt 121.1 lb

## 2019-05-26 DIAGNOSIS — F419 Anxiety disorder, unspecified: Secondary | ICD-10-CM

## 2019-05-26 DIAGNOSIS — F329 Major depressive disorder, single episode, unspecified: Secondary | ICD-10-CM

## 2019-05-26 DIAGNOSIS — Z79899 Other long term (current) drug therapy: Secondary | ICD-10-CM | POA: Diagnosis not present

## 2019-05-26 DIAGNOSIS — S53401A Unspecified sprain of right elbow, initial encounter: Secondary | ICD-10-CM | POA: Diagnosis not present

## 2019-05-26 DIAGNOSIS — F32A Depression, unspecified: Secondary | ICD-10-CM

## 2019-05-26 MED ORDER — DICLOFENAC SODIUM 1 % EX GEL: 2.0000 g | Freq: Four times a day (QID) | CUTANEOUS | 1 refills | Status: DC | PRN

## 2019-05-26 NOTE — Progress Notes (Signed)
Pre visit review using our clinic review tool, if applicable. No additional management support is needed unless otherwise documented below in the visit note. 
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
## 2019-05-26 NOTE — Patient Instructions (Signed)
Please go to the lab and provide a urine sample  Use Voltaren gel 3 times a day and the area you are hurting   Tylenol  500 mg OTC 2 tabs a day every 8 hours as needed for pain  ICE to the right arm at nighttime  When you come back for your physical you let me know how you are doing

## 2019-05-27 ENCOUNTER — Encounter: Payer: Self-pay | Admitting: Internal Medicine

## 2019-05-27 NOTE — Assessment & Plan Note (Signed)
Sprain, right arm: She seems to have a sprain at the triceps, I do not see a deformity or swelling indicating a ruptured tendon. Recommend conservative treatment with topical NSAIDs, Tylenol, ice and wait for couple of weeks.  If she is not better might benefit from sports medicine or PT referral. Anxiety: On Xanax, check UDS She already has an appointment for a physical in few days.

## 2019-06-03 ENCOUNTER — Other Ambulatory Visit: Payer: Self-pay

## 2019-06-03 ENCOUNTER — Encounter: Payer: BC Managed Care – PPO | Admitting: Internal Medicine

## 2019-06-03 LAB — PAIN MGMT, PROFILE 8 W/CONF, U
6 Acetylmorphine: NEGATIVE ng/mL
Alcohol Metabolites: POSITIVE ng/mL — AB (ref <500)
Alphahydroxyalprazolam: 34 ng/mL
Alphahydroxymidazolam: NEGATIVE ng/mL
Alphahydroxytriazolam: NEGATIVE ng/mL
Aminoclonazepam: NEGATIVE ng/mL
Amphetamines: NEGATIVE ng/mL
Benzodiazepines: POSITIVE ng/mL
Buprenorphine, Urine: NEGATIVE ng/mL
Cocaine Metabolite: NEGATIVE ng/mL
Creatinine: 19.9 mg/dL
Ethyl Glucuronide (ETG): 3041 ng/mL
Ethyl Sulfate (ETS): 1120 ng/mL
Hydroxyethylflurazepam: NEGATIVE ng/mL
Lorazepam: NEGATIVE ng/mL
MDMA: NEGATIVE ng/mL
Marijuana Metabolite: NEGATIVE ng/mL
Nordiazepam: NEGATIVE ng/mL
Opiates: NEGATIVE ng/mL
Oxazepam: NEGATIVE ng/mL
Oxidant: NEGATIVE ug/mL
Oxycodone: NEGATIVE ng/mL
Specific Gravity: 1.004 (ref >=1.0)
Temazepam: NEGATIVE ng/mL
pH: 6.8 (ref 4.5–9.0)

## 2019-06-06 ENCOUNTER — Ambulatory Visit (INDEPENDENT_AMBULATORY_CARE_PROVIDER_SITE_OTHER): Payer: BC Managed Care – PPO | Admitting: Internal Medicine

## 2019-06-06 ENCOUNTER — Encounter: Payer: Self-pay | Admitting: Internal Medicine

## 2019-06-06 ENCOUNTER — Other Ambulatory Visit: Payer: Self-pay

## 2019-06-06 VITALS — BP 109/60 | HR 77 | Temp 97.8°F | Resp 16 | Ht 62.0 in | Wt 121.4 lb

## 2019-06-06 DIAGNOSIS — E785 Hyperlipidemia, unspecified: Secondary | ICD-10-CM

## 2019-06-06 DIAGNOSIS — Z0000 Encounter for general adult medical examination without abnormal findings: Secondary | ICD-10-CM

## 2019-06-06 DIAGNOSIS — Z1211 Encounter for screening for malignant neoplasm of colon: Secondary | ICD-10-CM
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
## 2019-06-06 NOTE — Progress Notes (Signed)
Pre visit review using our clinic review tool, if applicable. No additional management support is needed unless otherwise documented below in the visit note. 

## 2019-06-06 NOTE — Patient Instructions (Signed)
GO TO THE FRONT DESK Come back for a phusical in 1 year  , please make an appointment  Come back for blood work only, please make an appointment for tomorrow    We are committed to keeping you informed about the COVID-19 vaccine.  As the vaccine continues to become available for each phase, we will ensure that patients who meet the criteria receive the information they need to access vaccination opportunities. Continue to check your MyChart account and [email or web address] for updates. Please review the Phase 1b information below.  [facility] COVID-19 Vaccination Clinic - [place] On Tuesday, Jan. 19, the [facility] Division of Public Health [facility]) and [employer] [facility] began large-scale COVID-19 vaccinations at the [facility]. The vaccinations are appointment only and for those 65 and older.  Walk-ins will not be accepted.  All appointments are currently filled. Please join our waiting list for the next available appointments. We will contact you when appointments become available. Please do not sign up more than once.  Join Our Waiting List  Our daily vaccination capacity will continue to increase, including expansion of our [employer] site, increasing mobile clinics across our service region and plans to open COVID-19 vaccination clinics in [place] and [place] counties in February.  On Friday, Jan. 22, we announced the need to reschedule 10,400 vaccination appointments because of the state's decision not to allocate [facility] with COVID-19 vaccines for the week of Jan. 25. Our announcement of this news is available here.  Going forward, we will schedule vaccination appointments weekly based on vaccine allocations provided by the state.  On Friday, Jan. 29, the [facility] Department of Health and [employer] announced that [employer] and [facility] of [employer] will again be receiving vaccines. Our  announcement of this news is available here.  We will first schedule vaccination appointments for those whose appointments were cancelled last week, then schedule those on the [facility] waiting list in the order in which they registered. Those who had their appointment cancelled should expect notification by email (or by phone for those without email) at least 48 hours prior to their rescheduled appointment.  Other Vaccination Opportunities in Our Area We are also working in partnership with county health agencies in our service counties to ensure continuing vaccination availability in the weeks and months ahead. Learn more about each county's vaccination efforts in the website links below:   [place]  [facility] [place]'s phase 1b vaccination guidelines, prioritizing those 65 and over as the next eligible group to receive the COVID-19 vaccine, are detailed at https://www.carlson.net/.   Vaccine Safety and Effectiveness Clinical trials for the Pfizer COVID-19 vaccine involved 42,000 people and showed that the vaccine is more than 95% effective in preventing COVID-19 with no serious safety concerns. Similar results have been reported for the Moderna COVID-19 vaccine. Side effects reported in the Pfizer clinical trials include a sore arm at the injection site, fatigue, headache, chills and fever. While side effects from the Pfizer COVID-19 vaccine are higher than for a typical flu vaccine, they are lower in many ways than side effects from the leading vaccine to prevent shingles. Side effects are signs that a vaccine is working and are related to your immune system being stimulated to produce antibodies against infection. Side effects from vaccination are far less significant than health impacts from COVID-19.  Staying Informed Pharmacists, infectious disease doctors, critical care nurses and  other experts at [facility] continue to speak publicly through  media interviews and direct communication with our patients and communities about the safety, effectiveness and importance of vaccines to eliminate COVID-19. In addition, reliable information on vaccine safety, effectiveness, side effects and more is available on the following websites:  [place] Department of Health and Human Services COVID-19 Vaccine Information Website.  U.S. Centers for Disease Control and Prevention LGXQJ-19 Human resources officer.  Staying Safe We agree with the CDC on what we can do to help our communities get back to normal: Getting "back to normal" is going to take all of our tools. If we use all the tools we have, we stand the best chance of getting our families, communities, schools and workplaces "back to normal" sooner:  Get vaccinated as soon as vaccines become available within the phase of the state's vaccination rollout plan for which you meet the eligibility criteria.  Wear a mask.  Stay 6 feet from others and avoid crowds.  Wash hands often.  For our most current information, please visit [email or web address].

## 2019-06-07 NOTE — Assessment & Plan Note (Signed)
Here for CPX Hyperlipidemia: On Lipitor, checking labs Anxiety, depression: Controlled on Xanax and fluoxetine Sprain, right arm: Very mild improvement, still have pain with certain movements, we agreed on continue observing, resting the arm, call if not better in the next 2 weeks  RTC for labs tomorrow RTC for CPX 1 year.

## 2019-06-07 NOTE — Assessment & Plan Note (Signed)
-  Td 2014-- had a local reaction -S/p shingrix x 2 at [facility]   -Had a flu shot 2020 , tolerated well -Hoping to get the COVID-19 vaccination soon - Female care per gyn, OV schedule for next week, had a MMG 2020. Had a DEXA 05/2017: wnl -CCS: + FH of colon polyps, colonoscopy 10/2009, negative, next due in few months, will refer back to Dr. Christella Hartigan - (+)FH CAD: will need a LDL close to 100  - (+) FH ovarian cancer, f/u per gyn  -labs: CMP, FLP, CBC, TSH -Diet and exercise discussed

## 2019-06-08 ENCOUNTER — Other Ambulatory Visit (INDEPENDENT_AMBULATORY_CARE_PROVIDER_SITE_OTHER): Payer: BC Managed Care – PPO

## 2019-06-08 ENCOUNTER — Other Ambulatory Visit: Payer: Self-pay

## 2019-06-08 DIAGNOSIS — E785 Hyperlipidemia, unspecified: Secondary | ICD-10-CM

## 2019-06-08 DIAGNOSIS — Z0000 Encounter for general adult medical examination without abnormal findings: Secondary | ICD-10-CM

## 2019-06-08 LAB — CBC WITH DIFFERENTIAL/PLATELET
Basophils Absolute: 0 10*3/uL (ref 0.0–0.1)
Basophils Relative: 0.8 % (ref 0.0–3.0)
Eosinophils Absolute: 0.1 10*3/uL (ref 0.0–0.7)
Eosinophils Relative: 2 % (ref 0.0–5.0)
HCT: 41.4 % (ref 36.0–46.0)
Hemoglobin: 14.1 g/dL (ref 12.0–15.0)
Lymphocytes Relative: 44.4 % (ref 12.0–46.0)
Lymphs Abs: 2.1 10*3/uL (ref 0.7–4.0)
MCHC: 33.9 g/dL (ref 30.0–36.0)
MCV: 101.1 fl — ABNORMAL HIGH (ref 78.0–100.0)
Monocytes Absolute: 0.5 10*3/uL (ref 0.1–1.0)
Monocytes Relative: 10.9 % (ref 3.0–12.0)
Neutro Abs: 2 10*3/uL (ref 1.4–7.7)
Neutrophils Relative %: 41.9 % — ABNORMAL LOW (ref 43.0–77.0)
Platelets: 161 10*3/uL (ref 150.0–400.0)
RBC: 4.1 Mil/uL (ref 3.87–5.11)
RDW: 13.4 % (ref 11.5–15.5)
WBC: 4.8 10*3/uL (ref 4.0–10.5)

## 2019-06-08 LAB — COMPREHENSIVE METABOLIC PANEL
ALT: 23 U/L (ref 0–35)
AST: 23 U/L (ref 0–37)
Albumin: 4.4 g/dL (ref 3.5–5.2)
Alkaline Phosphatase: 37 U/L — ABNORMAL LOW (ref 39–117)
BUN: 21 mg/dL (ref 6–23)
CO2: 32 mEq/L (ref 19–32)
Calcium: 8.9 mg/dL (ref 8.4–10.5)
Chloride: 99 mEq/L (ref 96–112)
Creatinine, Ser: 0.74 mg/dL (ref 0.40–1.20)
GFR: 79.77 mL/min (ref >60.00)
Glucose, Bld: 81 mg/dL (ref 70–99)
Potassium: 4.6 mEq/L (ref 3.5–5.1)
Sodium: 137 mEq/L (ref 135–145)
Total Bilirubin: 0.4 mg/dL (ref 0.2–1.2)
Total Protein: 6.4 g/dL (ref 6.0–8.3)

## 2019-06-08 LAB — LIPID PANEL
Cholesterol: 187 mg/dL (ref 0–200)
HDL: 86.9 mg/dL (ref >39.00)
LDL Cholesterol: 76 mg/dL (ref 0–99)
NonHDL: 100.19
Total CHOL/HDL Ratio: 2
Triglycerides: 120 mg/dL (ref 0.0–149.0)
VLDL: 24 mg/dL (ref 0.0–40.0)

## 2019-06-08 LAB — TSH: TSH: 3.57 u[IU]/mL (ref 0.35–4.50)

## 2019-06-10 ENCOUNTER — Telehealth: Payer: Self-pay | Admitting: *Deleted

## 2019-06-10 ENCOUNTER — Other Ambulatory Visit: Payer: BC Managed Care – PPO

## 2019-06-10 DIAGNOSIS — R7989 Other specified abnormal findings of blood chemistry: Secondary | ICD-10-CM

## 2019-06-10 NOTE — Telephone Encounter (Signed)
-----   Message from [place], [place] sent at 06/10/2019 12:46 PM EST ----- Regarding: FW: Try to add a B12, DX abnormal CBC Can we add b12 and folate please? ----- Message ----- From: Wanda Plump, MD Sent: 06/10/2019  12:44 PM EST To: Wanda Plump, MD, [place], CMA Subject: Try to add a B12, DX abnormal CBC

## 2019-06-10 NOTE — Telephone Encounter (Signed)
B12 unable to be added to in house lab. Can send to [facility]. Future order placed and [facility] will release and send to [facility].

## 2019-06-10 NOTE — Telephone Encounter (Signed)
FYI

## 2019-06-11 LAB — VITAMIN B12: Vitamin B-12: 844 pg/mL (ref 200–1100)

## 2019-06-17 ENCOUNTER — Encounter: Payer: Self-pay | Admitting: Internal Medicine

## 2019-06-25 ENCOUNTER — Other Ambulatory Visit: Payer: Self-pay | Admitting: Internal Medicine

## 2019-07-14 ENCOUNTER — Encounter: Payer: Self-pay | Admitting: Internal Medicine

## 2019-07-18 MED ORDER — FLUTICASONE PROPIONATE 50 MCG/ACT NA SUSP: 2.0000 | Freq: Every day | NASAL | 5 refills | Status: DC

## 2019-08-29 ENCOUNTER — Encounter: Payer: Self-pay | Admitting: Gastroenterology

## 2019-09-02 ENCOUNTER — Encounter: Payer: Self-pay | Admitting: Internal Medicine

## 2019-09-05 ENCOUNTER — Encounter: Payer: Self-pay | Admitting: Internal Medicine

## 2019-09-05 ENCOUNTER — Other Ambulatory Visit: Payer: Self-pay

## 2019-09-05 ENCOUNTER — Ambulatory Visit: Payer: BC Managed Care – PPO | Admitting: Internal Medicine

## 2019-09-05 ENCOUNTER — Ambulatory Visit (HOSPITAL_BASED_OUTPATIENT_CLINIC_OR_DEPARTMENT_OTHER)
Admission: RE | Admit: 2019-09-05 | Discharge: 2019-09-05 | Disposition: A | Discharge status: Home / Self Care | Payer: BC Managed Care – PPO | Source: Ambulatory Visit | Attending: Internal Medicine | Admitting: Internal Medicine

## 2019-09-05 VITALS — BP 118/67 | HR 77 | Temp 97.2°F | Resp 18 | Ht 62.0 in | Wt 121.2 lb

## 2019-09-05 DIAGNOSIS — S7002XA Contusion of left hip, initial encounter: Secondary | ICD-10-CM

## 2019-09-05 DIAGNOSIS — M25562 Pain in left knee: Secondary | ICD-10-CM | POA: Insufficient documentation

## 2019-09-05 DIAGNOSIS — M25552 Pain in left hip: Secondary | ICD-10-CM | POA: Insufficient documentation

## 2019-09-05 DIAGNOSIS — W19XXXA Unspecified fall, initial encounter: Secondary | ICD-10-CM | POA: Diagnosis not present

## 2019-09-05 NOTE — Patient Instructions (Signed)
  Get the x-ray at the first floor  Ice the area twice a day  Tylenol  500 mg OTC 2 tabs a day every 8 hours as needed for pain  Watch for more falls or injuries  If you are not improving in the next 7 to 10 days or if you get worse: Call the office
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
## 2019-09-05 NOTE — Progress Notes (Signed)
Pre visit review using our clinic review tool, if applicable. No additional management support is needed unless otherwise documented below in the visit note. 

## 2019-09-06 NOTE — Assessment & Plan Note (Signed)
Hip/pelvis contusion: We will get a hip x-ray, continue Tylenol, declined a stronger pain medicine. Ice Remove the pressure with using a cane Call in the next week, if not gradually better will need further evaluation or perhaps dedicated pelvic x-ray.

## 2019-10-10 ENCOUNTER — Other Ambulatory Visit: Payer: Self-pay

## 2019-10-10 ENCOUNTER — Ambulatory Visit (AMBULATORY_SURGERY_CENTER): Payer: Self-pay | Admitting: *Deleted

## 2019-10-10 ENCOUNTER — Encounter: Payer: Self-pay | Admitting: Internal Medicine

## 2019-10-10 VITALS — Ht 61.5 in | Wt 122.0 lb

## 2019-10-10 DIAGNOSIS — Z1211 Encounter for screening for malignant neoplasm of colon: Secondary | ICD-10-CM

## 2019-10-10 NOTE — Progress Notes (Signed)

## 2019-10-24 ENCOUNTER — Ambulatory Visit (AMBULATORY_SURGERY_CENTER): Payer: BC Managed Care – PPO | Admitting: Gastroenterology

## 2019-10-24 ENCOUNTER — Other Ambulatory Visit: Payer: Self-pay

## 2019-10-24 ENCOUNTER — Encounter: Payer: Self-pay | Admitting: Gastroenterology

## 2019-10-24 VITALS — BP 116/64 | HR 57 | Temp 97.1°F | Resp 14 | Ht 62.0 in | Wt 121.0 lb

## 2019-10-24 DIAGNOSIS — Z1211 Encounter for screening for malignant neoplasm of colon: Secondary | ICD-10-CM

## 2019-10-24 MED ORDER — SODIUM CHLORIDE 0.9 % IV SOLN: 500.0000 mL | Freq: Once | INTRAVENOUS | Status: DC

## 2019-10-24 NOTE — Patient Instructions (Signed)
HANDOUTS PROVIDED ON: HEMORRHOIDS  Your next colonoscopy should occur in 10 years.    You may resume your previous diet and medication schedule.  Thank you for allowing us to care for you today!!!   YOU HAD AN ENDOSCOPIC PROCEDURE TODAY AT THE [facility]:   Refer to the procedure report that was given to you for any specific questions about what was found during the examination.  If the procedure report does not answer your questions, please call your gastroenterologist to clarify.  If you requested that your care partner not be given the details of your procedure findings, then the procedure report has been included in a sealed envelope for you to review at your convenience later.  YOU SHOULD EXPECT: Some feelings of bloating in the abdomen. Passage of more gas than usual.  Walking can help get rid of the air that was put into your GI tract during the procedure and reduce the bloating. If you had a lower endoscopy (such as a colonoscopy or flexible sigmoidoscopy) you may notice spotting of blood in your stool or on the toilet paper. If you underwent a bowel prep for your procedure, you may not have a normal bowel movement for a few days.  Please Note:  You might notice some irritation and congestion in your nose or some drainage.  This is from the oxygen used during your procedure.  There is no need for concern and it should clear up in a day or so.  SYMPTOMS TO REPORT IMMEDIATELY:   Following lower endoscopy (colonoscopy or flexible sigmoidoscopy):  Excessive amounts of blood in the stool  Significant tenderness or worsening of abdominal pains  Swelling of the abdomen that is new, acute  Fever of 100F or higher  For urgent or emergent issues, a gastroenterologist can be reached at any hour by calling [phone]. Do not use MyChart messaging for urgent concerns.    DIET:  We do recommend a small meal at first, but then you may proceed to your regular diet.  Drink  plenty of fluids but you should avoid alcoholic beverages for 24 hours.  ACTIVITY:  You should plan to take it easy for the rest of today and you should NOT DRIVE or use heavy machinery until tomorrow (because of the sedation medicines used during the test).    FOLLOW UP: Our staff will call the number listed on your records 48-72 hours following your procedure to check on you and address any questions or concerns that you may have regarding the information given to you following your procedure. If we do not reach you, we will leave a message.  We will attempt to reach you two times.  During this call, we will ask if you have developed any symptoms of COVID 19. If you develop any symptoms (ie: fever, flu-like symptoms, shortness of breath, cough etc.) before then, please call [phone].  If you test positive for Covid 19 in the 2 weeks post procedure, please call and report this information to us.    If any biopsies were taken you will be contacted by phone or by letter within the next 1-3 weeks.  Please call us at [phone] if you have not heard about the biopsies in 3 weeks.    SIGNATURES/CONFIDENTIALITY: You and/or your care partner have signed paperwork which will be entered into your electronic medical record.  These signatures attest to the fact that that the information above on your After Visit Summary has been reviewed   and is understood.  Full responsibility of the confidentiality of this discharge information lies with you and/or your care-partner. 

## 2019-10-24 NOTE — Progress Notes (Signed)
Report to PACU, RN, vss, BBS= Clear.  

## 2019-10-24 NOTE — Op Note (Signed)
[facility] Patient Name: Pamela Hansen Procedure Date: 10/24/2019 8:07 AM MRN: [id number] Endoscopist: Rachael Fee , MD Age: 59 Referring MD:  Date of Birth: 11/03/1960 Gender: Female Account #: [id number] Procedure:                Colonoscopy Indications:              Screening for colorectal malignant neoplasm Medicines:                Monitored Anesthesia Care Procedure:                Pre-Anesthesia Assessment:                           - Prior to the procedure, a History and Physical                            was performed, and patient medications and                            allergies were reviewed. The patient's tolerance of                            previous anesthesia was also reviewed. The risks                            and benefits of the procedure and the sedation                            options and risks were discussed with the patient.                            All questions were answered, and informed consent                            was obtained. Prior Anticoagulants: The patient has                            taken no previous anticoagulant or antiplatelet                            agents. ASA Grade Assessment: II - A patient with                            mild systemic disease. After reviewing the risks                            and benefits, the patient was deemed in                            satisfactory condition to undergo the procedure.                           After obtaining informed consent, the colonoscope  was passed under direct vision. Throughout the                            procedure, the patient's blood pressure, pulse, and                            oxygen saturations were monitored continuously. The                            Colonoscope was introduced through the anus and                            advanced to the the cecum, identified by                            appendiceal orifice and  ileocecal valve. The                            colonoscopy was performed without difficulty. The                            patient tolerated the procedure well. The quality                            of the bowel preparation was good. The ileocecal                            valve, appendiceal orifice, and rectum were                            photographed. Scope In: 8:16:45 AM Scope Out: 8:27:03 AM Scope Withdrawal Time: 0 hours 4 minutes 50 seconds  Total Procedure Duration: 0 hours 10 minutes 18 seconds  Findings:                 Internal hemorrhoids were found. The hemorrhoids                            were small.                           The exam was otherwise without abnormality on                            direct and retroflexion views. Complications:            No immediate complications. Estimated blood loss:                            None. Estimated Blood Loss:     Estimated blood loss: none. Impression:               - Internal hemorrhoids.                           - The examination was otherwise normal on direct  and retroflexion views.                           - No specimens collected. Recommendation:           - Patient has a contact number available for                            emergencies. The signs and symptoms of potential                            delayed complications were discussed with the                            patient. Return to normal activities tomorrow.                            Written discharge instructions were provided to the                            patient.                           - Resume previous diet.                           - Continue present medications.                           - Repeat colonoscopy in 10 years for screening. Rachael Fee, MD 10/24/2019 8:29:31 AM This report has been signed electronically.

## 2019-10-24 NOTE — Progress Notes (Signed)
VS by SM  Pt's states no medical or surgical changes since previsit or office visit.  

## 2019-10-25 ENCOUNTER — Encounter: Payer: Self-pay | Admitting: Internal Medicine

## 2019-10-25 DIAGNOSIS — L920 Granuloma annulare: Secondary | ICD-10-CM | POA: Insufficient documentation

## 2019-10-26 ENCOUNTER — Other Ambulatory Visit: Payer: Self-pay

## 2019-10-26 ENCOUNTER — Encounter: Payer: Self-pay | Admitting: Family Medicine

## 2019-10-26 ENCOUNTER — Ambulatory Visit: Payer: BC Managed Care – PPO | Admitting: Family Medicine

## 2019-10-26 ENCOUNTER — Telehealth: Payer: Self-pay

## 2019-10-26 VITALS — BP 102/62 | HR 91 | Temp 95.7°F | Ht 61.5 in | Wt 124.1 lb

## 2019-10-26 DIAGNOSIS — T63441A Toxic effect of venom of bees, accidental (unintentional), initial encounter: Secondary | ICD-10-CM

## 2019-10-26 MED ORDER — PREDNISONE 20 MG PO TABS: 40.0000 mg | ORAL_TABLET | Freq: Every day | ORAL | 0 refills | Status: AC

## 2019-10-26 NOTE — Telephone Encounter (Signed)
  Follow up Call-  Call back number 10/24/2019  Post procedure Call Back phone  # [phone]  Permission to leave phone message Yes  Some recent data might be hidden     Patient questions:  Do you have a fever, pain , or abdominal swelling? No. Pain Score  0 *  Have you tolerated food without any problems? Yes.    Have you been able to return to your normal activities? Yes.    Do you have any questions about your discharge instructions: Diet   No. Medications  No. Follow up visit  No.  Do you have questions or concerns about your Care? No.  Actions: * If pain score is 4 or above: No action needed, pain <4.  1. Have you developed a fever since your procedure? no  2.   Have you had an respiratory symptoms (SOB or cough) since your procedure? no  3.   Have you tested positive for COVID 19 since your procedure no  4.   Have you had any family members/close contacts diagnosed with the COVID 19 since your procedure?  no   If yes to any of these questions please route to Laverna Peace, RN and Charlett Lango, RN

## 2019-10-26 NOTE — Telephone Encounter (Signed)
Appointment scheduled with the patient.

## 2019-10-26 NOTE — Patient Instructions (Signed)
Consider Benadryl and ice/cold packs as needed.  Try not to itch.  Continue Neosporin twice daily on your pinky finger.  Things to look out for: increasing pain not relieved by ibuprofen/acetaminophen, fevers, spreading redness, drainage of pus, or foul odor.  Let us know if you need anything.

## 2019-10-26 NOTE — Telephone Encounter (Signed)
First post procedure follow up call, no answer 

## 2019-10-26 NOTE — Progress Notes (Signed)
Chief Complaint  Patient presents with  . Insect Bite    Pamela Hansen is a 58 y.o. female here for a skin complaint.  Duration: 1 day Location: R forearm Pruritic? Yes Painful? No Drainage? No New soaps/lotions/topicals/detergents? No Sick contacts? No Other associated symptoms: swelling Therapies tried thus far: none Stung by bee Another area on L pinky that she scraped 2 d ago. Has been using Neosporin. No fevers.   Past Medical History:  Diagnosis Date  . Back pain    local injections 2012   . Closed fracture of right distal radius 2018  . Deafness   . Depression   . Dry eye syndrome    on restasis and fish oil  . Granuloma annulare   . Menopause   . Ovarian cyst   . Palpable abd. aorta 02/25/2010    BP 102/62 (BP Location: Left Arm, Patient Position: Sitting, Cuff Size: Normal)   Pulse 91   Temp (!) 95.7 F (35.4 C) (Temporal)   Ht 5' 1.5" (1.562 m)   Wt 124 lb 2 oz (56.3 kg)   SpO2 97%   BMI 23.07 kg/m  Gen: awake, alert, appearing stated age Lungs: No accessory muscle use Skin: See below. No drainage, TTP, fluctuance, excoriation Psych: Age appropriate judgment and insight   L pinky finger   R forearm  Bee sting, accidental or unintentional, initial encounter - Plan: predniSONE (DELTASONE) 20 MG tablet  Pred burst, ice, consider Benadryl though she may get headache w her hx. TAO for 7-10 d with her finger. Warning signs and symptoms verbalized and written down in AVS.  F/u prn. The patient voiced understanding and agreement to the plan.  Jilda Roche [place], DO 10/26/19 2:33 PM

## 2019-11-12 ENCOUNTER — Other Ambulatory Visit: Payer: Self-pay | Admitting: Internal Medicine

## 2019-11-18 ENCOUNTER — Telehealth: Payer: Self-pay | Admitting: Internal Medicine

## 2019-11-18 NOTE — Telephone Encounter (Signed)
PDMP okay, Rx sent 

## 2019-11-18 NOTE — Telephone Encounter (Signed)
Alprazolam refill.   Last OV: 09/05/2019 Last Fill: 05/16/2019 #180 and 0RF Pt sig: 1/2 tab during the day prn, and 1-2 tabs qhs prn UDS: 05/26/2019 Low risk

## 2019-11-28 LAB — HM MAMMOGRAPHY

## 2020-02-18 ENCOUNTER — Ambulatory Visit: Payer: BC Managed Care – PPO | Attending: Internal Medicine

## 2020-02-18 DIAGNOSIS — Z23 Encounter for immunization: Secondary | ICD-10-CM

## 2020-02-18 NOTE — Progress Notes (Signed)
   Covid-19 Vaccination [facility]  Name:  Pamela Hansen    MRN: [id number] DOB: [birth year]  02/18/2020  Pamela Hansen was observed post Covid-19 immunization for 15 minutes without incident. She was provided with Vaccine Information Sheet and instruction to access the V-Safe system.   Pamela Hansen was instructed to call 911 with any severe reactions post vaccine: Marland Kitchen Difficulty breathing  . Swelling of face and throat  . A fast heartbeat  . A bad rash all over body  . Dizziness and weakness

## 2020-02-20 ENCOUNTER — Telehealth: Payer: Self-pay | Admitting: Internal Medicine

## 2020-02-20 NOTE — Telephone Encounter (Signed)
PDMP okay, Rx sent 

## 2020-02-20 NOTE — Telephone Encounter (Signed)
Requesting: alprazolam 0.5mg  Contract: 04/16/2018 UDS: 05/26/2019 Low risk Last Visit: 09/05/2019 Next Visit: 06/08/2020 Last Refill: 11/18/2019 #180 and 0RF Pt sig: 1/2 tablet daily prn, and 1-2 tabs qhs prn  Please Advise

## 2020-03-16 ENCOUNTER — Encounter: Payer: Self-pay | Admitting: Internal Medicine

## 2020-03-28 ENCOUNTER — Other Ambulatory Visit: Payer: Self-pay | Admitting: Internal Medicine

## 2020-06-08 ENCOUNTER — Other Ambulatory Visit: Payer: Self-pay

## 2020-06-08 ENCOUNTER — Encounter: Payer: Self-pay | Admitting: Internal Medicine

## 2020-06-08 ENCOUNTER — Ambulatory Visit (INDEPENDENT_AMBULATORY_CARE_PROVIDER_SITE_OTHER): Payer: BC Managed Care – PPO | Admitting: Internal Medicine

## 2020-06-08 VITALS — BP 119/76 | HR 72 | Temp 97.9°F | Resp 16 | Ht 62.0 in | Wt 126.4 lb

## 2020-06-08 DIAGNOSIS — F32A Depression, unspecified: Secondary | ICD-10-CM

## 2020-06-08 DIAGNOSIS — Z0000 Encounter for general adult medical examination without abnormal findings: Secondary | ICD-10-CM

## 2020-06-08 DIAGNOSIS — F419 Anxiety disorder, unspecified: Secondary | ICD-10-CM

## 2020-06-08 DIAGNOSIS — R7989 Other specified abnormal findings of blood chemistry: Secondary | ICD-10-CM

## 2020-06-08 DIAGNOSIS — E785 Hyperlipidemia, unspecified: Secondary | ICD-10-CM | POA: Diagnosis not present

## 2020-06-08 DIAGNOSIS — L920 Granuloma annulare: Secondary | ICD-10-CM

## 2020-06-08 DIAGNOSIS — Z79899 Other long term (current) drug therapy: Secondary | ICD-10-CM

## 2020-06-08 LAB — LIPID PANEL
Cholesterol: 185 mg/dL (ref 0–200)
HDL: 97.5 mg/dL (ref >39.00)
LDL Cholesterol: 76 mg/dL (ref 0–99)
NonHDL: 87.3
Total CHOL/HDL Ratio: 2
Triglycerides: 57 mg/dL (ref 0.0–149.0)
VLDL: 11.4 mg/dL (ref 0.0–40.0)

## 2020-06-08 LAB — CBC WITH DIFFERENTIAL/PLATELET
Basophils Absolute: 0 10*3/uL (ref 0.0–0.1)
Basophils Relative: 0.5 % (ref 0.0–3.0)
Eosinophils Absolute: 0.1 10*3/uL (ref 0.0–0.7)
Eosinophils Relative: 1.9 % (ref 0.0–5.0)
HCT: 40.7 % (ref 36.0–46.0)
Hemoglobin: 13.6 g/dL (ref 12.0–15.0)
Lymphocytes Relative: 37.7 % (ref 12.0–46.0)
Lymphs Abs: 1.5 10*3/uL (ref 0.7–4.0)
MCHC: 33.5 g/dL (ref 30.0–36.0)
MCV: 102.1 fl — ABNORMAL HIGH (ref 78.0–100.0)
Monocytes Absolute: 0.4 10*3/uL (ref 0.1–1.0)
Monocytes Relative: 10.1 % (ref 3.0–12.0)
Neutro Abs: 1.9 10*3/uL (ref 1.4–7.7)
Neutrophils Relative %: 49.8 % (ref 43.0–77.0)
Platelets: 158 10*3/uL (ref 150.0–400.0)
RBC: 3.99 Mil/uL (ref 3.87–5.11)
RDW: 12.9 % (ref 11.5–15.5)
WBC: 3.9 10*3/uL — ABNORMAL LOW (ref 4.0–10.5)

## 2020-06-08 LAB — COMPREHENSIVE METABOLIC PANEL
ALT: 19 U/L (ref 0–35)
AST: 20 U/L (ref 0–37)
Albumin: 4.5 g/dL (ref 3.5–5.2)
Alkaline Phosphatase: 34 U/L — ABNORMAL LOW (ref 39–117)
BUN: 18 mg/dL (ref 6–23)
CO2: 33 mEq/L — ABNORMAL HIGH (ref 19–32)
Calcium: 9.1 mg/dL (ref 8.4–10.5)
Chloride: 101 mEq/L (ref 96–112)
Creatinine, Ser: 0.76 mg/dL (ref 0.40–1.20)
GFR: 84.2 mL/min (ref >60.00)
Glucose, Bld: 83 mg/dL (ref 70–99)
Potassium: 4.6 mEq/L (ref 3.5–5.1)
Sodium: 138 mEq/L (ref 135–145)
Total Bilirubin: 0.6 mg/dL (ref 0.2–1.2)
Total Protein: 6.6 g/dL (ref 6.0–8.3)

## 2020-06-08 LAB — FOLATE: Folate: 16 ng/mL (ref >5.9)

## 2020-06-08 LAB — TSH: TSH: 2.19 u[IU]/mL (ref 0.35–4.50)

## 2020-06-08 NOTE — Progress Notes (Signed)
Pre visit review using our clinic review tool, if applicable. No additional management support is needed unless otherwise documented below in the visit note. 

## 2020-06-08 NOTE — Patient Instructions (Signed)
GO TO THE LAB : Get the blood work     GO TO THE FRONT DESK, PLEASE SCHEDULE YOUR APPOINTMENTS Come back for a physical exam in 1 year  Granuloma annulare: Call for a dermatology referral if needed
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
## 2020-06-10 ENCOUNTER — Encounter: Payer: Self-pay | Admitting: Internal Medicine

## 2020-06-10 LAB — DRUG MONITORING, PANEL 8 WITH CONFIRMATION, URINE
6 Acetylmorphine: NEGATIVE ng/mL (ref <10)
Alcohol Metabolites: POSITIVE ng/mL — AB
Alphahydroxyalprazolam: 168 ng/mL — ABNORMAL HIGH (ref <25)
Alphahydroxymidazolam: NEGATIVE ng/mL (ref <50)
Alphahydroxytriazolam: NEGATIVE ng/mL (ref <50)
Aminoclonazepam: NEGATIVE ng/mL (ref <25)
Amphetamines: NEGATIVE ng/mL (ref <500)
Benzodiazepines: POSITIVE ng/mL — AB (ref <100)
Buprenorphine, Urine: NEGATIVE ng/mL (ref <5)
Cocaine Metabolite: NEGATIVE ng/mL (ref <150)
Creatinine: 76.4 mg/dL
Ethyl Glucuronide (ETG): 77193 ng/mL — ABNORMAL HIGH (ref <500)
Ethyl Sulfate (ETS): 19350 ng/mL — ABNORMAL HIGH (ref <100)
Hydroxyethylflurazepam: NEGATIVE ng/mL (ref <50)
Lorazepam: NEGATIVE ng/mL (ref <50)
MDMA: NEGATIVE ng/mL (ref <500)
Marijuana Metabolite: NEGATIVE ng/mL (ref <20)
Nordiazepam: NEGATIVE ng/mL (ref <50)
Opiates: NEGATIVE ng/mL (ref <100)
Oxazepam: NEGATIVE ng/mL (ref <50)
Oxidant: NEGATIVE ug/mL
Oxycodone: NEGATIVE ng/mL (ref <100)
Temazepam: NEGATIVE ng/mL (ref <50)
pH: 6.3 (ref 4.5–9.0)

## 2020-06-10 LAB — DM TEMPLATE

## 2020-06-10 NOTE — Assessment & Plan Note (Signed)
Here for CPX Hyperlipidemia: Continue moderate to statin therapy with Lipitor 20 mg, labs Anxiety, depression: She is doing well, self decrease fluoxetine to 20 mg daily, uses Xanax to sleep, UDS and contract today Granuloma annulare: History of, has noted more spots this year than before, exam is confirmatory, refer to dermatology. RTC 1 year

## 2020-06-10 NOTE — Assessment & Plan Note (Signed)
-  Td 2014 >>> Had a local reaction -S[facility]p [facility] at [facility]  - covid vax x 3 -Had a flu shot fall 2021 - Female care per gyn, to have a  MMG this year -Had a DEXA 05/2017: wnl -CCS: + FH of colon polyps, colonoscopy 10/2009, negative, s/p  cscope 10/2019, next per GI - (+)FH CAD: will need a LDL close to 100  - (+) FH ovarian cancer, f/u per gyn  -labs: CMP, FLP, CBC, TSH, UDS -Diet and exercise discussed

## 2020-06-19 ENCOUNTER — Telehealth: Payer: Self-pay | Admitting: Dermatology

## 2020-06-19 NOTE — Telephone Encounter (Signed)
Patient is calling for a referral appointment from Willow Ora, MD.  Patient is scheduled for 11/13/2020 at 2:00 with Janalyn Harder, MD.

## 2020-06-19 NOTE — Telephone Encounter (Signed)
Referral attached to appointment

## 2020-06-21 ENCOUNTER — Encounter: Payer: Self-pay | Admitting: Internal Medicine

## 2020-06-21 DIAGNOSIS — L920 Granuloma annulare: Secondary | ICD-10-CM

## 2020-07-24 ENCOUNTER — Encounter: Payer: Self-pay | Admitting: Internal Medicine

## 2020-08-14 ENCOUNTER — Other Ambulatory Visit: Payer: Self-pay | Admitting: Internal Medicine

## 2020-08-27 ENCOUNTER — Telehealth: Payer: Self-pay | Admitting: Internal Medicine

## 2020-08-27 NOTE — Telephone Encounter (Signed)
Requesting: alprazolam 0.5mg  Contract: 06/08/20 UDS: 06/08/20 Last Visit: 06/08/20 Next Visit: 06/14/2021 Last Refill: 02/20/2020 #180 and 1RF  Please Advise

## 2020-08-28 NOTE — Telephone Encounter (Signed)
Prescription sent

## 2020-09-05 ENCOUNTER — Ambulatory Visit: Payer: BC Managed Care – PPO | Admitting: Internal Medicine

## 2020-09-05 ENCOUNTER — Encounter: Payer: Self-pay | Admitting: Internal Medicine

## 2020-09-05 ENCOUNTER — Other Ambulatory Visit: Payer: Self-pay

## 2020-09-05 VITALS — BP 112/70 | HR 80 | Temp 98.3°F | Resp 16 | Ht 62.0 in | Wt 119.0 lb

## 2020-09-05 DIAGNOSIS — L309 Dermatitis, unspecified: Secondary | ICD-10-CM

## 2020-09-05 DIAGNOSIS — F419 Anxiety disorder, unspecified: Secondary | ICD-10-CM | POA: Diagnosis not present

## 2020-09-05 DIAGNOSIS — F32A Depression, unspecified: Secondary | ICD-10-CM | POA: Diagnosis not present

## 2020-09-05 DIAGNOSIS — E785 Hyperlipidemia, unspecified: Secondary | ICD-10-CM | POA: Diagnosis not present

## 2020-09-05 MED ORDER — PREDNISONE 10 MG PO TABS
ORAL_TABLET | ORAL | 0 refills | Status: DC
Start: 2020-09-05 — End: 2021-06-14

## 2020-09-05 MED ORDER — HYDROCORTISONE 2.5 % EX CREA: TOPICAL_CREAM | Freq: Two times a day (BID) | CUTANEOUS | 0 refills | Status: DC
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
## 2020-09-05 NOTE — Patient Instructions (Signed)
Use the cream twice a day for 1 week  Take prednisone for few days  Call if not gradually better

## 2020-09-06 NOTE — Assessment & Plan Note (Signed)
Ant bites reaction/rash: Likely local allergic reaction, in the past she required po prednisone to get better.  Prednisone Rx, topical steroids, call if no better Hyperlipidemia: She noted aches at the right elbow and left hip, she also noted her memory was not the best, she stopped atorvastatin few days ago and she feels better.  Rec  to recheck FLP in few months RX Pravachol?, declined, we agreed to check at her next physical. Anxiety, depression: Feeling well emotionally, self d/c  Fluoxetine.  Is taking Xanax only 1 at night.  She tried to take less Xanax but she could not sleep.  I think is okay to take a low-dose of Xanax as needed, she also could substitute Xanax for Tylenol PM if so desired. EtOH: When she saw  + alcohol in her UDS  she got concerned, was drinking 3-4 servings qhs,  stopped drinking  3 months ago.  She recognizes she was drinking too much, praised . RTC for CPX

## 2020-11-13 ENCOUNTER — Ambulatory Visit: Payer: BC Managed Care – PPO | Admitting: Dermatology

## 2020-11-28 LAB — HM MAMMOGRAPHY

## 2020-11-29 LAB — HM PAP SMEAR: HM Pap smear: NEGATIVE

## 2021-01-17 ENCOUNTER — Encounter: Payer: Self-pay | Admitting: Internal Medicine

## 2021-01-23 ENCOUNTER — Other Ambulatory Visit: Payer: Self-pay | Admitting: Internal Medicine

## 2021-03-27 ENCOUNTER — Encounter: Payer: Self-pay | Admitting: Internal Medicine

## 2021-03-27 MED ORDER — ATORVASTATIN CALCIUM 20 MG PO TABS: 20.0000 mg | ORAL_TABLET | Freq: Every day | ORAL | 1 refills | Status: DC

## 2021-06-14 ENCOUNTER — Encounter: Payer: Self-pay | Admitting: Internal Medicine

## 2021-06-14 ENCOUNTER — Ambulatory Visit (INDEPENDENT_AMBULATORY_CARE_PROVIDER_SITE_OTHER): Payer: BC Managed Care – PPO | Admitting: Internal Medicine

## 2021-06-14 VITALS — BP 118/60 | HR 74 | Temp 97.9°F | Resp 16 | Ht 62.0 in | Wt 120.4 lb

## 2021-06-14 DIAGNOSIS — F419 Anxiety disorder, unspecified: Secondary | ICD-10-CM | POA: Diagnosis not present

## 2021-06-14 DIAGNOSIS — F32A Depression, unspecified: Secondary | ICD-10-CM

## 2021-06-14 DIAGNOSIS — Z0000 Encounter for general adult medical examination without abnormal findings: Secondary | ICD-10-CM | POA: Diagnosis not present

## 2021-06-14 DIAGNOSIS — Z79899 Other long term (current) drug therapy: Secondary | ICD-10-CM | POA: Diagnosis not present

## 2021-06-14 DIAGNOSIS — E785 Hyperlipidemia, unspecified: Secondary | ICD-10-CM

## 2021-06-14 LAB — CBC WITH DIFFERENTIAL/PLATELET
Basophils Absolute: 0 10*3/uL (ref 0.0–0.1)
Basophils Relative: 0.5 % (ref 0.0–3.0)
Eosinophils Absolute: 0 10*3/uL (ref 0.0–0.7)
Eosinophils Relative: 1 % (ref 0.0–5.0)
HCT: 39.3 % (ref 36.0–46.0)
Hemoglobin: 13.1 g/dL (ref 12.0–15.0)
Lymphocytes Relative: 34.5 % (ref 12.0–46.0)
Lymphs Abs: 1.4 10*3/uL (ref 0.7–4.0)
MCHC: 33.3 g/dL (ref 30.0–36.0)
MCV: 100.2 fl — ABNORMAL HIGH (ref 78.0–100.0)
Monocytes Absolute: 0.3 10*3/uL (ref 0.1–1.0)
Monocytes Relative: 8.7 % (ref 3.0–12.0)
Neutro Abs: 2.2 10*3/uL (ref 1.4–7.7)
Neutrophils Relative %: 55.3 % (ref 43.0–77.0)
Platelets: 165 10*3/uL (ref 150.0–400.0)
RBC: 3.92 Mil/uL (ref 3.87–5.11)
RDW: 13.5 % (ref 11.5–15.5)
WBC: 4 10*3/uL (ref 4.0–10.5)

## 2021-06-14 LAB — LIPID PANEL
Cholesterol: 173 mg/dL (ref 0–200)
HDL: 89.4 mg/dL (ref >39.00)
LDL Cholesterol: 72 mg/dL (ref 0–99)
NonHDL: 83.73
Total CHOL/HDL Ratio: 2
Triglycerides: 58 mg/dL (ref 0.0–149.0)
VLDL: 11.6 mg/dL (ref 0.0–40.0)

## 2021-06-14 LAB — COMPREHENSIVE METABOLIC PANEL
ALT: 16 U/L (ref 0–35)
AST: 15 U/L (ref 0–37)
Albumin: 4.1 g/dL (ref 3.5–5.2)
Alkaline Phosphatase: 34 U/L — ABNORMAL LOW (ref 39–117)
BUN: 11 mg/dL (ref 6–23)
CO2: 35 mEq/L — ABNORMAL HIGH (ref 19–32)
Calcium: 8.9 mg/dL (ref 8.4–10.5)
Chloride: 103 mEq/L (ref 96–112)
Creatinine, Ser: 0.67 mg/dL (ref 0.40–1.20)
GFR: 93.25 mL/min (ref >60.00)
Glucose, Bld: 82 mg/dL (ref 70–99)
Potassium: 4.2 mEq/L (ref 3.5–5.1)
Sodium: 141 mEq/L (ref 135–145)
Total Bilirubin: 0.6 mg/dL (ref 0.2–1.2)
Total Protein: 6.1 g/dL (ref 6.0–8.3)

## 2021-06-14 LAB — B12 AND FOLATE PANEL
Folate: 15.4 ng/mL (ref >5.9)
Vitamin B-12: 962 pg/mL — ABNORMAL HIGH (ref 211–911)

## 2021-06-14 NOTE — Assessment & Plan Note (Signed)
Here for CPX Hyperlipidemia: On Lipitor. Anxiety depression: Takes Xanax at night, has a leftover fluoxetine, takes as needed, RF if needed Paresthesia, second right finger: No clear etiology, no red flags, observation, call if symptoms spread. EtOH: Only drinks socially. RTC 1 year
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
## 2021-06-14 NOTE — Patient Instructions (Addendum)
   GO TO THE LAB : Get the blood work     GO TO THE FRONT DESK, PLEASE SCHEDULE YOUR APPOINTMENTS Come back for a physical exam in 1 year 

## 2021-06-16 LAB — DM TEMPLATE

## 2021-06-17 LAB — DRUG MONITORING PANEL 375977 , URINE
Alcohol Metabolites: NEGATIVE ng/mL (ref <500)
Alphahydroxyalprazolam: 70 ng/mL — ABNORMAL HIGH (ref <25)
Alphahydroxymidazolam: NEGATIVE ng/mL (ref <50)
Alphahydroxytriazolam: NEGATIVE ng/mL (ref <50)
Aminoclonazepam: NEGATIVE ng/mL (ref <25)
Amphetamines: NEGATIVE ng/mL (ref <500)
Barbiturates: NEGATIVE ng/mL (ref <300)
Benzodiazepines: POSITIVE ng/mL — AB (ref <100)
Cocaine Metabolite: NEGATIVE ng/mL (ref <150)
Desmethyltramadol: NEGATIVE ng/mL (ref <100)
Hydroxyethylflurazepam: NEGATIVE ng/mL (ref <50)
Lorazepam: NEGATIVE ng/mL (ref <50)
Marijuana Metabolite: NEGATIVE ng/mL (ref <20)
Nordiazepam: NEGATIVE ng/mL (ref <50)
Opiates: NEGATIVE ng/mL (ref <100)
Oxazepam: NEGATIVE ng/mL (ref <50)
Oxycodone: NEGATIVE ng/mL (ref <100)
Temazepam: NEGATIVE ng/mL (ref <50)
Tramadol: NEGATIVE ng/mL (ref <100)

## 2021-06-17 LAB — DM TEMPLATE

## 2021-10-07 IMAGING — DX DG HIP (WITH OR WITHOUT PELVIS) 2-3V*L*
3 series · 3 of 3 positions shown · non-contrast
Comparison: CT 02/01/2010

CLINICAL DATA: Left hip contusion, fall

EXAM:
DG HIP (WITH OR WITHOUT PELVIS) 2-3V LEFT

[pelvis ap]
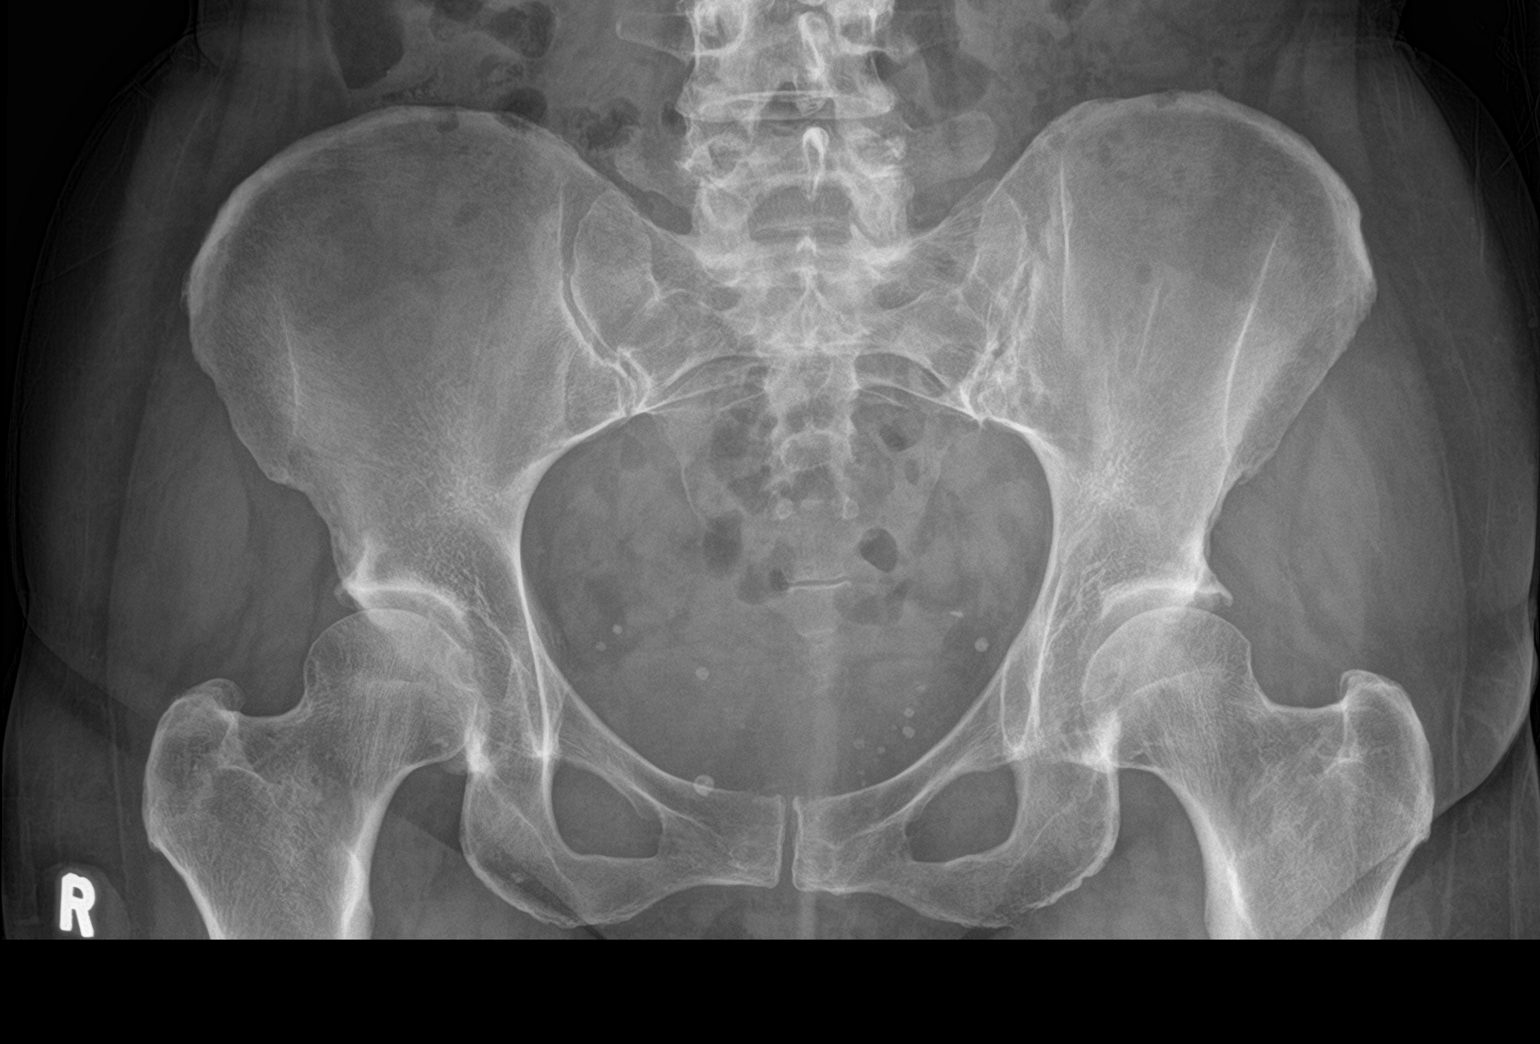

[hip ap]
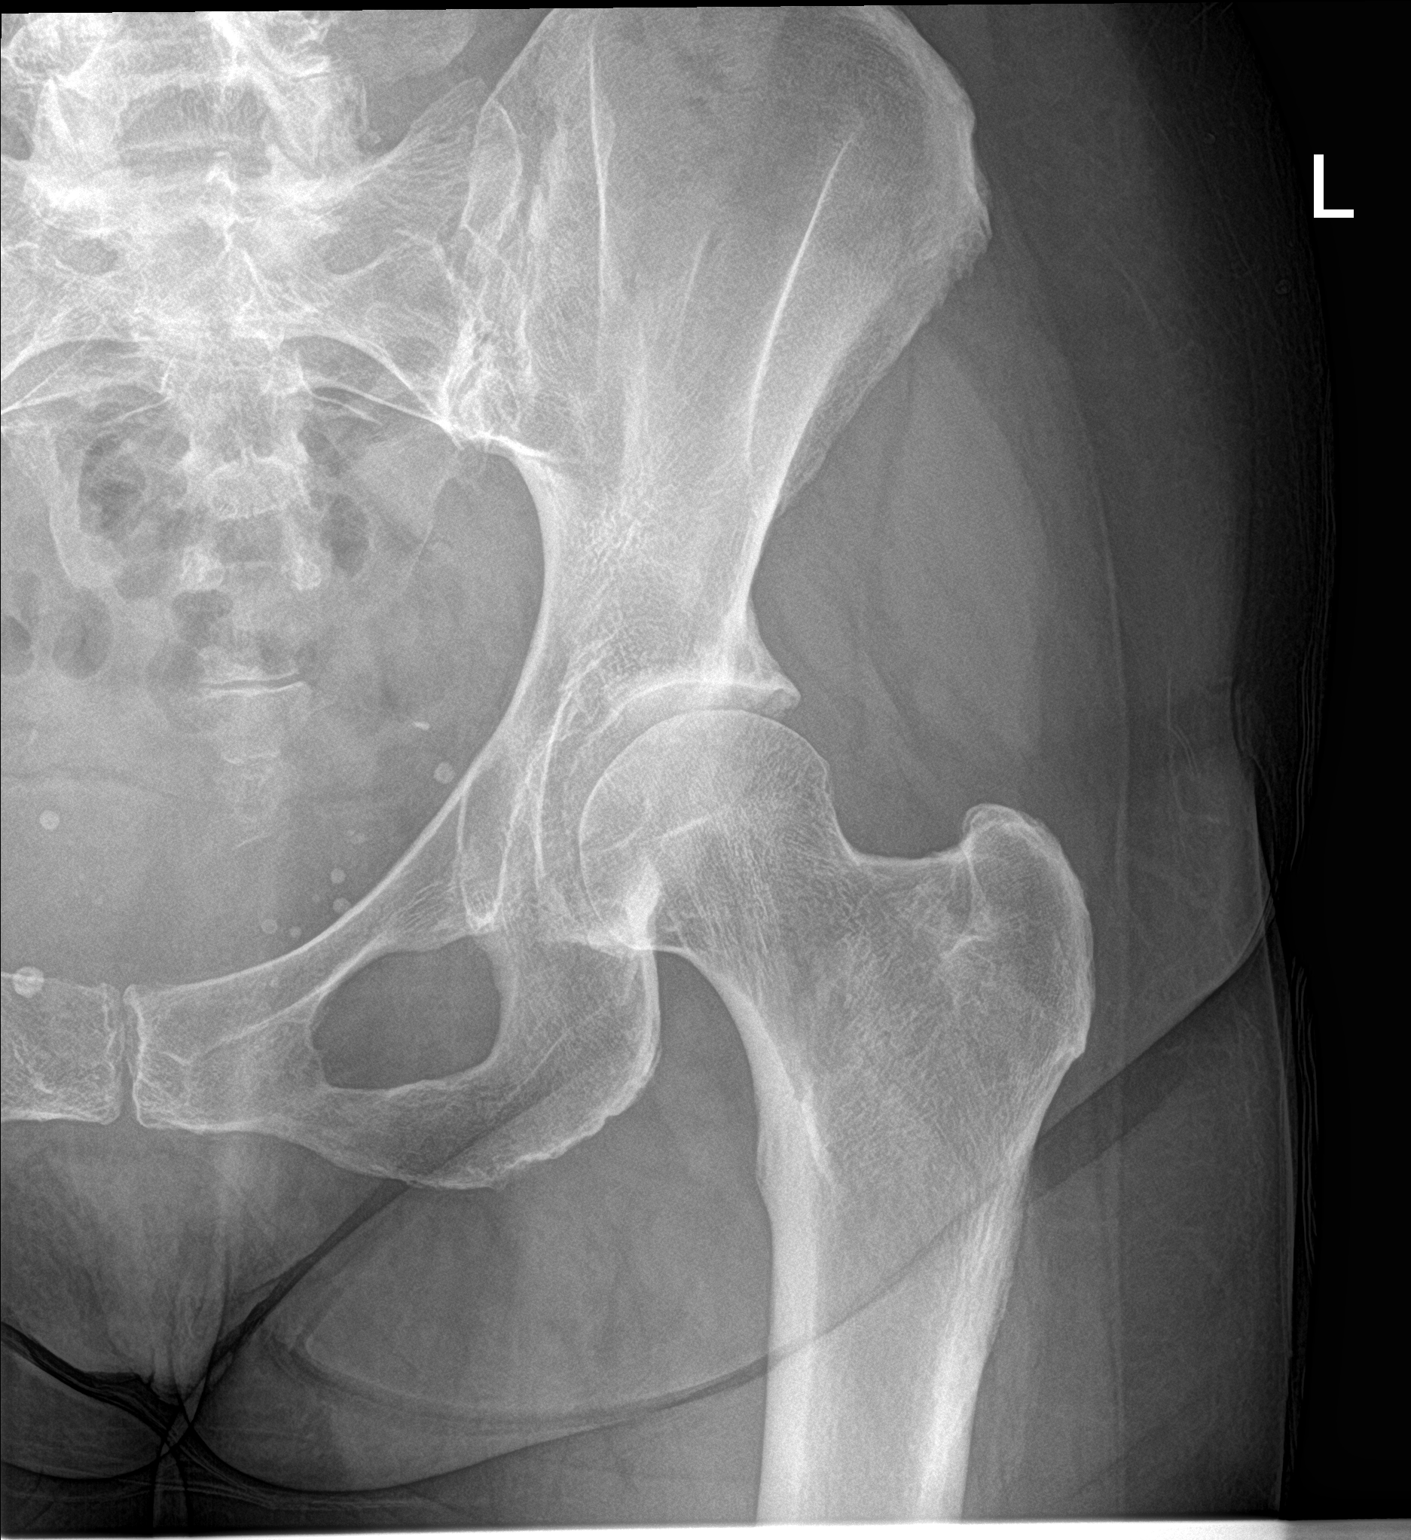

[hip lat]
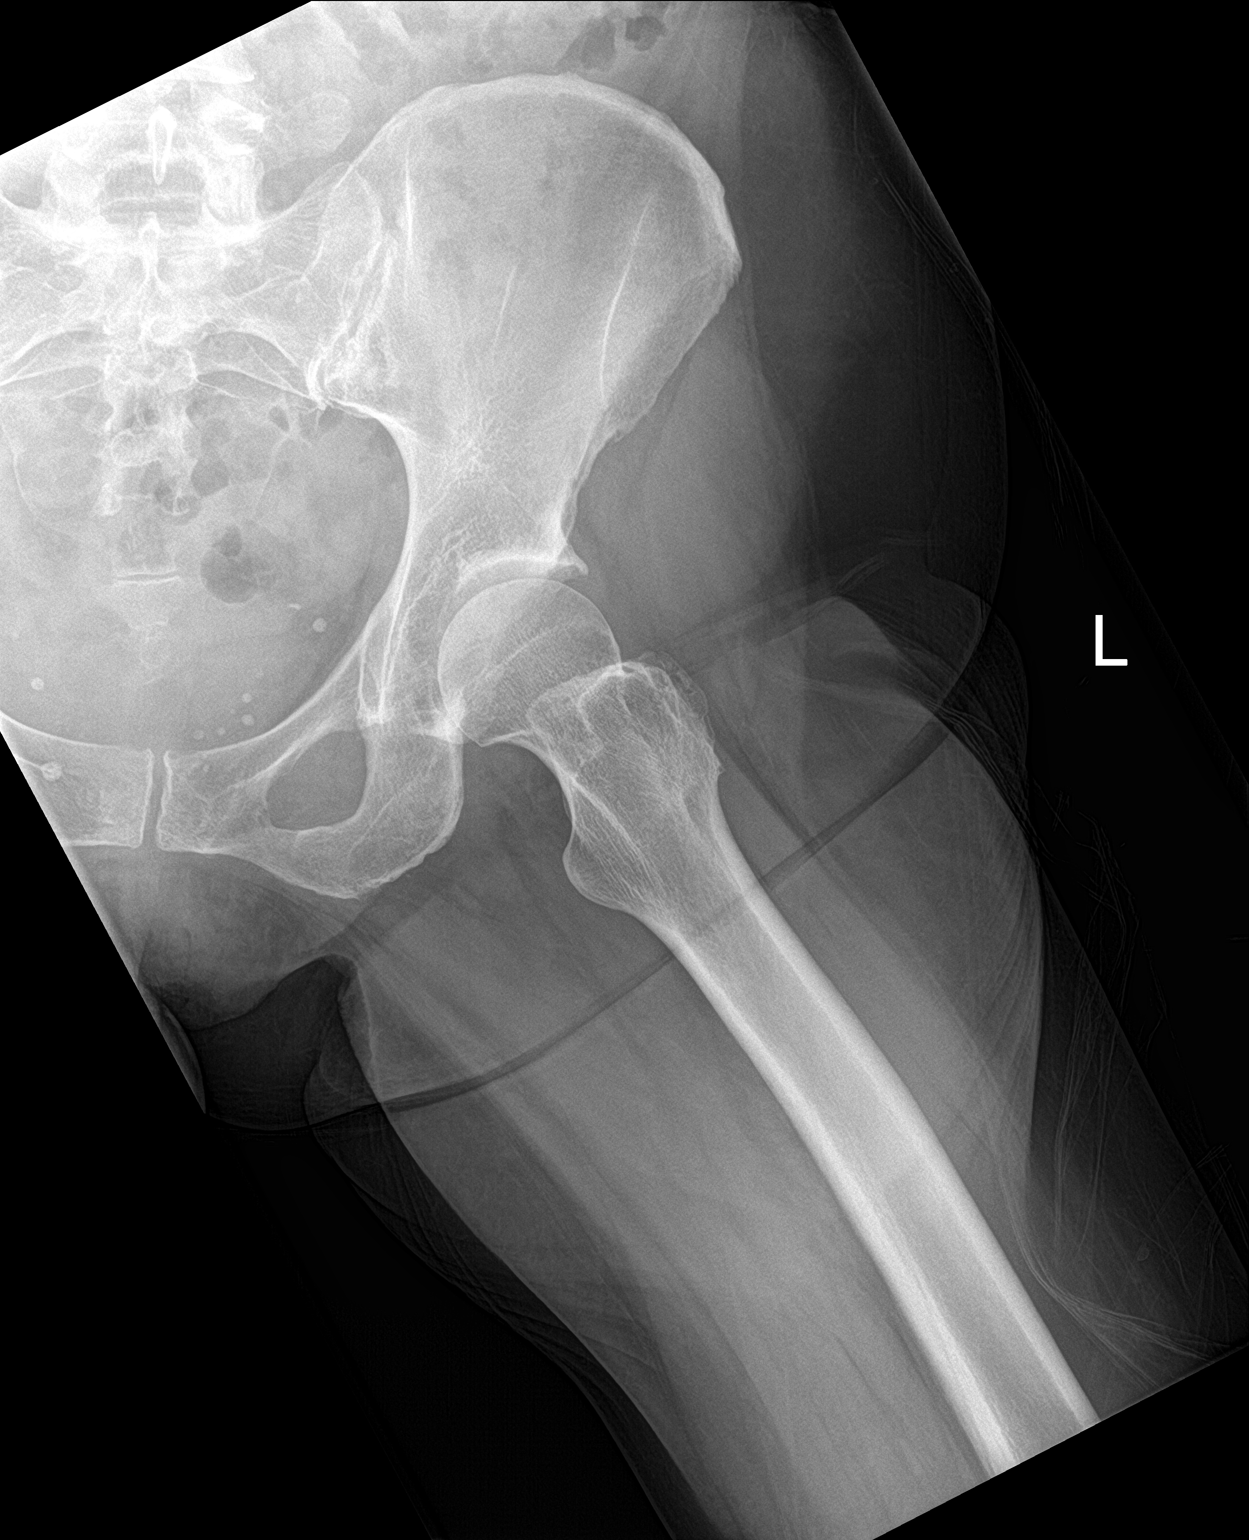

[3 of 3 positions shown; findings below may reference images not displayed]

FINDINGS: Calcified phleboliths in the pelvis. No fracture or malalignment.
Joint space is maintained.
IMPRESSION: No acute osseous abnormality.

## 2021-11-04 ENCOUNTER — Telehealth: Payer: Self-pay | Admitting: Internal Medicine

## 2021-11-04 NOTE — Telephone Encounter (Signed)
PDMP okay, Rx sent 

## 2021-11-04 NOTE — Telephone Encounter (Signed)
Requesting: alprazolam 0.5mg   Contract: 06/08/20 UDS: 06/14/21 Last Visit: 06/14/21 Next Visit: 06/20/22 Last Refill: 08/28/20 #180 and 1RF   Please Advise

## 2021-12-25 ENCOUNTER — Encounter: Payer: Self-pay | Admitting: Internal Medicine

## 2022-03-11 ENCOUNTER — Encounter: Payer: Self-pay | Admitting: Internal Medicine

## 2022-03-12 ENCOUNTER — Ambulatory Visit: Payer: BC Managed Care – PPO | Admitting: Internal Medicine

## 2022-03-12 ENCOUNTER — Ambulatory Visit: Payer: BC Managed Care – PPO | Admitting: Family Medicine

## 2022-03-12 ENCOUNTER — Encounter: Payer: Self-pay | Admitting: Internal Medicine

## 2022-03-12 VITALS — BP 116/72 | HR 101 | Temp 98.2°F | Resp 16 | Ht 62.0 in | Wt 122.4 lb

## 2022-03-12 DIAGNOSIS — B349 Viral infection, unspecified: Secondary | ICD-10-CM

## 2022-03-12 DIAGNOSIS — J019 Acute sinusitis, unspecified: Secondary | ICD-10-CM

## 2022-03-12 DIAGNOSIS — J029 Acute pharyngitis, unspecified: Secondary | ICD-10-CM | POA: Diagnosis not present

## 2022-03-12 LAB — POC COVID19 BINAXNOW: SARS Coronavirus 2 Ag: NEGATIVE

## 2022-03-12 LAB — POCT RAPID STREP A (OFFICE): Rapid Strep A Screen: NEGATIVE

## 2022-03-12 MED ORDER — AMOXICILLIN 875 MG PO TABS: 875.0000 mg | ORAL_TABLET | Freq: Two times a day (BID) | ORAL | 0 refills | Status: DC
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
## 2022-03-12 NOTE — Patient Instructions (Signed)
   For cough:  Take Mucinex DM or Robitussin-DM OTC.  Follow the instructions in the box.   For nasal congestion: -Use over-the-counter Flonase: 2 nasal sprays on each side of the nose in the morning until you feel better  -Use OTC Astepro 2 nasal sprays on each side of the nose twice daily until better  - Avoid decongestants such as  Pseudoephedrine or phenylephrine    Amoxicillin as prescribed   Call if not gradually better over the next  10 days   Call anytime if the symptoms are severe

## 2022-03-13 NOTE — Assessment & Plan Note (Signed)
Viral syndrome/sinusitis As described above, had 1 COVID test negative at home, re-tested today >> Negative.  Strep test negative. Most of the symptoms are concentrated around the sinus area. Will treat for sinusitis Plan: Amoxicillin, Flonase, Astepro Tylenol, rest, fluids. If not gradually better

## 2022-03-17 ENCOUNTER — Telehealth: Payer: Self-pay | Admitting: Internal Medicine

## 2022-03-17 NOTE — Telephone Encounter (Signed)
Please advise 

## 2022-03-17 NOTE — Telephone Encounter (Signed)
Pt HOH, PCP recommendations sent via Mychart.

## 2022-03-17 NOTE — Telephone Encounter (Signed)
In addition to what she is doing: Add a low-dose of pseudoephedrine 30 mg over-the-counter: 1 tablet 3 times a day as needed. Previously I advised her to avoid it but okay to take a low-dose, she does need to check her blood pressure to be sure is not going more than 140s.

## 2022-03-17 NOTE — Telephone Encounter (Signed)
Pt called stating that her symptoms have not improved since her appt with Dr. Drue Novel last week. Pt was wondering if there was anything else that Dr. Drue Novel would recommend to help treat her symptoms. Please Advise.

## 2022-04-17 ENCOUNTER — Telehealth: Payer: Self-pay

## 2022-04-17 ENCOUNTER — Emergency Department (HOSPITAL_COMMUNITY): Payer: BC Managed Care – PPO

## 2022-04-17 ENCOUNTER — Encounter: Payer: Self-pay | Admitting: Internal Medicine

## 2022-04-17 ENCOUNTER — Emergency Department (HOSPITAL_COMMUNITY)
Admission: EM | Admit: 2022-04-17 | Discharge: 2022-04-17 | Discharge status: Against Medical Advice | Payer: BC Managed Care – PPO | Attending: Emergency Medicine | Admitting: Emergency Medicine

## 2022-04-17 ENCOUNTER — Other Ambulatory Visit: Payer: Self-pay

## 2022-04-17 DIAGNOSIS — R110 Nausea: Secondary | ICD-10-CM | POA: Diagnosis not present

## 2022-04-17 DIAGNOSIS — R079 Chest pain, unspecified: Secondary | ICD-10-CM | POA: Diagnosis present

## 2022-04-17 LAB — BASIC METABOLIC PANEL
Anion gap: 8 (ref 5–15)
BUN: 11 mg/dL (ref 8–23)
CO2: 28 mmol/L (ref 22–32)
Calcium: 9 mg/dL (ref 8.9–10.3)
Chloride: 105 mmol/L (ref 98–111)
Creatinine, Ser: 0.65 mg/dL (ref 0.44–1.00)
GFR, Estimated: >60 mL/min (ref >60)
Glucose, Bld: 104 mg/dL — ABNORMAL HIGH (ref 70–99)
Potassium: 4.4 mmol/L (ref 3.5–5.1)
Sodium: 141 mmol/L (ref 135–145)

## 2022-04-17 LAB — CBC
HCT: 39.8 % (ref 36.0–46.0)
Hemoglobin: 13.6 g/dL (ref 12.0–15.0)
MCH: 33.8 pg (ref 26.0–34.0)
MCHC: 34.2 g/dL (ref 30.0–36.0)
MCV: 99 fL (ref 80.0–100.0)
Platelets: 161 10*3/uL (ref 150–400)
RBC: 4.02 MIL/uL (ref 3.87–5.11)
RDW: 11.9 % (ref 11.5–15.5)
WBC: 5 10*3/uL (ref 4.0–10.5)
nRBC: 0 % (ref 0.0–0.2)

## 2022-04-17 LAB — TROPONIN I (HIGH SENSITIVITY)
Troponin I (High Sensitivity): 3 ng/L (ref <18)
Troponin I (High Sensitivity): 4 ng/L (ref <18)

## 2022-04-17 NOTE — ED Triage Notes (Addendum)
EMS stated, pt has had chest pain with nausea with chest pressure that comes and goes.  18g left AC Nitro .4  ASA 324mg   Pt. Stated, its not pain its pressure and it comes in waves

## 2022-04-17 NOTE — Telephone Encounter (Signed)
FYI. Pt in ED.  ?

## 2022-04-17 NOTE — Telephone Encounter (Signed)
Noted, will review ED notes when available

## 2022-04-17 NOTE — Telephone Encounter (Signed)
Nurse Assessment Nurse: Fayrene Fearing, RN, Shanda Bumps Date/Time Lamount Cohen Time): 04/17/2022 7:20:21 AM Confirm and document reason for call. If symptomatic, describe symptoms. ---Caller states she has been experiencing nausea and chest pain. Symptoms began yesterday; came on in waves. Described as pressure in chest at left chest. Could not sleep at all last night. Does the patient have any new or worsening symptoms? ---Yes Will a triage be completed? ---Yes Related visit to physician within the last 2 weeks? ---No Does the PT have any chronic conditions? (i.e. diabetes, asthma, this includes High risk factors for pregnancy, etc.) ---Yes List chronic conditions. ---hard of hearing Is this a behavioral health or substance abuse call? ---No Guidelines Guideline Title Affirmed Question Affirmed Notes Nurse Date/Time Lamount Cohen Time) Chest Pain [1] Chest pain lasts > 5 minutes AND [2] age > 56 Tsosie Billing 04/17/2022 7:21:48 AM Disp. Time Lamount Cohen Time) Disposition Final User 04/17/2022 7:19:25 AM Send to Urgent Queue Griffin Dakin 04/17/2022 7:23:10 AM Call EMS 911 Now Yes Fayrene Fearing, RN, Shanda Bumps 04/17/2022 7:29:15 AM 911 Outcome Documentation Fayrene Fearing, RN, Shanda Bumps PLEASE NOTE: All timestamps contained within this report are represented as [place] Standard Time. CONFIDENTIALTY NOTICE: This fax transmission is intended only for the addressee. It contains information that is legally privileged, confidential or otherwise protected from use or disclosure. If you are not the intended recipient, you are strictly prohibited from reviewing, disclosing, copying using or disseminating any of this information or taking any action in reliance on or regarding this information. If you have received this fax in error, please notify us immediately by telephone so that we can arrange for its return to [place]. Phone: [phone], Toll-Free: [phone], Fax: [phone] Page: 2 of 2 Call Id: [id number] Disp. Time  Lamount Cohen Time) Disposition Final User Reason: EMS enroute per caller Final Disposition 04/17/2022 7:23:10 AM Call EMS 911 Now Yes Fayrene Fearing, RN, Rockney Ghee Disagree/Comply Comply Caller Understands Yes PreDisposition InappropriateToAsk Care Advice Given Per Guideline CALL EMS 911 NOW: * Immediate medical attention is needed. You need to hang up and call 911 (or an ambulance). * Triager Discretion: I'll call you back in a few minutes to be sure you were able to reach them. CARE ADVICE given per Chest Pain (Adult) guideline. Referrals GO TO FACILITY UNDECIDED

## 2022-05-12 ENCOUNTER — Telehealth: Payer: Self-pay | Admitting: Internal Medicine

## 2022-05-12 NOTE — Telephone Encounter (Signed)
PDMP okay, Rx sent 

## 2022-05-12 NOTE — Telephone Encounter (Signed)
Requesting: alprazolam 0.5mg   Contract: 06/08/20 UDS: 06/14/21 Last Visit: 03/12/22 Next Visit: 06/20/22 Last Refill: 11/04/21 #180 and 1RF   Please Advise

## 2022-05-25 ENCOUNTER — Other Ambulatory Visit: Payer: Self-pay | Admitting: Internal Medicine

## 2022-06-20 ENCOUNTER — Ambulatory Visit (INDEPENDENT_AMBULATORY_CARE_PROVIDER_SITE_OTHER): Payer: BC Managed Care – PPO | Admitting: Internal Medicine

## 2022-06-20 ENCOUNTER — Encounter: Payer: Self-pay | Admitting: Internal Medicine

## 2022-06-20 VITALS — BP 116/78 | HR 72 | Temp 98.0°F | Resp 16 | Ht 62.0 in | Wt 125.1 lb

## 2022-06-20 DIAGNOSIS — F419 Anxiety disorder, unspecified: Secondary | ICD-10-CM | POA: Diagnosis not present

## 2022-06-20 DIAGNOSIS — Z0000 Encounter for general adult medical examination without abnormal findings: Secondary | ICD-10-CM | POA: Diagnosis not present

## 2022-06-20 DIAGNOSIS — E785 Hyperlipidemia, unspecified: Secondary | ICD-10-CM | POA: Diagnosis not present

## 2022-06-20 DIAGNOSIS — F32A Depression, unspecified: Secondary | ICD-10-CM

## 2022-06-20 DIAGNOSIS — Z79899 Other long term (current) drug therapy: Secondary | ICD-10-CM | POA: Diagnosis not present

## 2022-06-20 LAB — LIPID PANEL
Cholesterol: 178 mg/dL (ref 0–200)
HDL: 91.9 mg/dL (ref >39.00)
LDL Cholesterol: 76 mg/dL (ref 0–99)
NonHDL: 85.85
Total CHOL/HDL Ratio: 2
Triglycerides: 51 mg/dL (ref 0.0–149.0)
VLDL: 10.2 mg/dL (ref 0.0–40.0)

## 2022-06-20 LAB — HEPATIC FUNCTION PANEL
ALT: 17 U/L (ref 0–35)
AST: 25 U/L (ref 0–37)
Albumin: 4.2 g/dL (ref 3.5–5.2)
Alkaline Phosphatase: 38 U/L — ABNORMAL LOW (ref 39–117)
Bilirubin, Direct: 0.2 mg/dL (ref 0.0–0.3)
Total Bilirubin: 0.8 mg/dL (ref 0.2–1.2)
Total Protein: 6.1 g/dL (ref 6.0–8.3)

## 2022-06-20 LAB — TSH: TSH: 2.74 u[IU]/mL (ref 0.35–5.50)
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
## 2022-06-20 NOTE — Assessment & Plan Note (Signed)
Here for CPX Dyslipidemia: On atorvastatin, checking LFTs and FLP Anxiety, depression: On Xanax as needed.  UDS Dry eye: Sees ophthalmology regularly RTC 1 year

## 2022-06-20 NOTE — Assessment & Plan Note (Signed)
-   Td 2014 >>> Had a local reaction -S/p shingrix x 2 per patient - covid vax - ~ 01-2022 per pt  -Had a flu shot  @[facility]  - Female care per gyn, MMG 11-2020 ([facility]), had a [facility] 2023, not on [facility] -Had a DEXA 05/2017: wnl -[facility]: + FH of colon polyps, colonoscopy 10/2009, negative, s/p  cscope 10/2019, next 10 years per Cscope report - (+)FH CAD: on atorva  - (+) FH ovarian cancer, f/u  per gyn   -labs:  LFTs FLP TSH   -Diet and exercise discussed -ACP information provided

## 2022-06-20 NOTE — Patient Instructions (Signed)
    GO TO THE LAB : Get the blood work     [place], [place] back for a physical exam in 1 year    "[place] of attorney" ,  "Living will" (Advance care planning documents)  If you already have a living will or healthcare power of attorney, is recommended you bring the copy to be scanned in your chart.   The document will be available to all the doctors you see in the system.  Advance care planning is a process that supports adults in  understanding and sharing their preferences regarding future medical care.  The patient's preferences are recorded in documents called Advance Directives and the can be modified at any time while the patient is in full mental capacity.   If you don't have one, please consider create one.      More information at: [email or web address]

## 2022-06-25 LAB — DRUG MONITORING PANEL 375977 , URINE
Alcohol Metabolites: POSITIVE ng/mL — AB (ref <500)
Alphahydroxyalprazolam: 51 ng/mL — ABNORMAL HIGH (ref <25)
Alphahydroxymidazolam: NEGATIVE ng/mL (ref <50)
Alphahydroxytriazolam: NEGATIVE ng/mL (ref <50)
Aminoclonazepam: NEGATIVE ng/mL (ref <25)
Amphetamines: NEGATIVE ng/mL (ref <500)
Barbiturates: NEGATIVE ng/mL (ref <300)
Benzodiazepines: POSITIVE ng/mL — AB (ref <100)
Cocaine Metabolite: NEGATIVE ng/mL (ref <150)
Desmethyltramadol: NEGATIVE ng/mL (ref <100)
Ethyl Glucuronide (ETG): 5120 ng/mL — ABNORMAL HIGH (ref <500)
Ethyl Sulfate (ETS): 1965 ng/mL — ABNORMAL HIGH (ref <100)
Hydroxyethylflurazepam: NEGATIVE ng/mL (ref <50)
Lorazepam: NEGATIVE ng/mL (ref <50)
Marijuana Metabolite: NEGATIVE ng/mL (ref <20)
Nordiazepam: NEGATIVE ng/mL (ref <50)
Opiates: NEGATIVE ng/mL (ref <100)
Oxazepam: NEGATIVE ng/mL (ref <50)
Oxycodone: NEGATIVE ng/mL (ref <100)
Temazepam: NEGATIVE ng/mL (ref <50)
Tramadol: NEGATIVE ng/mL (ref <100)

## 2022-06-25 LAB — DM TEMPLATE

## 2022-08-15 ENCOUNTER — Encounter: Payer: Self-pay | Admitting: Internal Medicine

## 2022-09-26 ENCOUNTER — Telehealth: Payer: Self-pay | Admitting: Internal Medicine

## 2022-09-26 NOTE — Telephone Encounter (Signed)
Requesting: alprazolam 0.5mg   Contract: 06/30/22 UDS: 06/20/22 Last Visit: 06/20/22 Next Visit: 07/03/23 Last Refill: 05/12/22 #180 and 0RF   Please Advise

## 2022-09-26 NOTE — Telephone Encounter (Signed)
PDMP okay, Rx sent 

## 2022-11-18 ENCOUNTER — Other Ambulatory Visit: Payer: Self-pay | Admitting: Internal Medicine

## 2022-12-01 LAB — HM MAMMOGRAPHY

## 2022-12-19 ENCOUNTER — Encounter: Payer: Self-pay | Admitting: Internal Medicine

## 2022-12-22 ENCOUNTER — Encounter: Payer: Self-pay | Admitting: Internal Medicine

## 2022-12-22 ENCOUNTER — Ambulatory Visit: Payer: BC Managed Care – PPO | Admitting: Internal Medicine

## 2022-12-22 VITALS — BP 122/62 | HR 72 | Temp 98.3°F | Resp 16 | Ht 62.0 in | Wt 124.2 lb

## 2022-12-22 DIAGNOSIS — J029 Acute pharyngitis, unspecified: Secondary | ICD-10-CM

## 2022-12-22 DIAGNOSIS — M25552 Pain in left hip: Secondary | ICD-10-CM | POA: Diagnosis not present
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
## 2022-12-22 NOTE — Assessment & Plan Note (Signed)
Sore throat: Currently with no evidence of any infection, throat is symmetric.  We agreed on observation, to call if not gradually better.   Get a throat culture for completeness  L hip pain: As described above, etiology not completely clear but no red flags.  Referral to PT?  She would like to do some yoga and stretching first and take Tylenol.  She will let me know if not improving for a sports medicine referral.   L trochanteric bursitis : Although she has not complained of pain on that area she was TTP on exam.  Recommend ice daily. Social: Her daughter Almira Coaster is having her first baby next year.  Congratulated. RTC as scheduled for CPX in few months

## 2022-12-22 NOTE — Patient Instructions (Signed)
  Sore throat: Let me know if you are not gradually better  Hip pain: Do the stretching as you are planning, take some Tylenol.  Call if not better  Bursitis: Apply ice at nighttime to the left hip.

## 2022-12-24 LAB — CULTURE, GROUP A STREP
MICRO NUMBER:: 15349265
SPECIMEN QUALITY:: ADEQUATE

## 2022-12-25 ENCOUNTER — Encounter: Payer: Self-pay | Admitting: Internal Medicine

## 2023-07-03 ENCOUNTER — Encounter: Payer: Self-pay | Admitting: Internal Medicine

## 2023-07-03 ENCOUNTER — Ambulatory Visit (INDEPENDENT_AMBULATORY_CARE_PROVIDER_SITE_OTHER): Payer: 59 | Admitting: Internal Medicine

## 2023-07-03 VITALS — BP 102/64 | HR 71 | Ht 61.5 in | Wt 122.6 lb

## 2023-07-03 DIAGNOSIS — E785 Hyperlipidemia, unspecified: Secondary | ICD-10-CM

## 2023-07-03 DIAGNOSIS — Z0000 Encounter for general adult medical examination without abnormal findings: Secondary | ICD-10-CM | POA: Diagnosis not present

## 2023-07-03 LAB — LIPID PANEL
Cholesterol: 243 mg/dL — ABNORMAL HIGH (ref 0–200)
HDL: 96.5 mg/dL (ref >39.00)
LDL Cholesterol: 136 mg/dL — ABNORMAL HIGH (ref 0–99)
NonHDL: 146.52
Total CHOL/HDL Ratio: 3
Triglycerides: 54 mg/dL (ref 0.0–149.0)
VLDL: 10.8 mg/dL (ref 0.0–40.0)

## 2023-07-03 LAB — CBC WITH DIFFERENTIAL/PLATELET
Basophils Absolute: 0 10*3/uL (ref 0.0–0.1)
Basophils Relative: 0.8 % (ref 0.0–3.0)
Eosinophils Absolute: 0.1 10*3/uL (ref 0.0–0.7)
Eosinophils Relative: 2.6 % (ref 0.0–5.0)
HCT: 42.6 % (ref 36.0–46.0)
Hemoglobin: 14.2 g/dL (ref 12.0–15.0)
Lymphocytes Relative: 34 % (ref 12.0–46.0)
Lymphs Abs: 1.4 10*3/uL (ref 0.7–4.0)
MCHC: 33.3 g/dL (ref 30.0–36.0)
MCV: 100.1 fL — ABNORMAL HIGH (ref 78.0–100.0)
Monocytes Absolute: 0.4 10*3/uL (ref 0.1–1.0)
Monocytes Relative: 9.4 % (ref 3.0–12.0)
Neutro Abs: 2.2 10*3/uL (ref 1.4–7.7)
Neutrophils Relative %: 53.2 % (ref 43.0–77.0)
Platelets: 182 10*3/uL (ref 150.0–400.0)
RBC: 4.25 Mil/uL (ref 3.87–5.11)
RDW: 12.9 % (ref 11.5–15.5)
WBC: 4.2 10*3/uL (ref 4.0–10.5)

## 2023-07-03 LAB — COMPREHENSIVE METABOLIC PANEL
ALT: 13 U/L (ref 0–35)
AST: 17 U/L (ref 0–37)
Albumin: 4.5 g/dL (ref 3.5–5.2)
Alkaline Phosphatase: 35 U/L — ABNORMAL LOW (ref 39–117)
BUN: 13 mg/dL (ref 6–23)
CO2: 31 meq/L (ref 19–32)
Calcium: 9 mg/dL (ref 8.4–10.5)
Chloride: 101 meq/L (ref 96–112)
Creatinine, Ser: 0.73 mg/dL (ref 0.40–1.20)
GFR: 86.48 mL/min (ref >60.00)
Glucose, Bld: 87 mg/dL (ref 70–99)
Potassium: 4.5 meq/L (ref 3.5–5.1)
Sodium: 139 meq/L (ref 135–145)
Total Bilirubin: 0.7 mg/dL (ref 0.2–1.2)
Total Protein: 6.6 g/dL (ref 6.0–8.3)

## 2023-07-03 MED ORDER — ATORVASTATIN CALCIUM 20 MG PO TABS: 20.0000 mg | ORAL_TABLET | Freq: Every day | ORAL | 3 refills | Status: AC
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
## 2023-07-03 NOTE — Assessment & Plan Note (Signed)
 Here for CPX  Other issues: Hyperlipidemia, ran out of atorvastatin a month ago, RF sent, checking labs.  If cholesterol elevated consider recheck in few months. Social: Has 3 grandchildren RTC 1 year.

## 2023-07-03 NOTE — Assessment & Plan Note (Signed)
 Here for CPX -Tdap 02/2023 per pt  -S/p shingrix x 2v - Patient reports she had a tetanus, COVID, flu and RSV shots. - Female care per gyn, PAP 11/28/20 ([facility]) MMG 11-2022 ([facility])  -Had a DEXA 05/2017: wnl; pt reports gyn will order a f/u dexa  -CCS: + FH of colon polyps, colonoscopy 10/2009, negative, s/p  cscope 10/2019, next 10 years per Cscope report - (+)FH CAD: on atorva  - (+) FH ovarian cancer, f/u  per gyn   -labs: CMP FLP CBC -Diet and exercise discussed -ACP information provided

## 2023-07-03 NOTE — Patient Instructions (Signed)
   GO TO THE LAB : Get the blood work     Please go to the front desk and schedule the following: Physical exam in 1 year       "Health Care Power of attorney" ,  "Living will" (Advance care planning documents)  If you already have a living will or healthcare power of attorney, is recommended you bring the copy to be scanned in your chart.   The document will be available to all the doctors you see in the system.  Advance care planning is a process that supports adults in  understanding and sharing their preferences regarding future medical care.  The patient's preferences are recorded in documents called Advance Directives and the can be modified at any time while the patient is in full mental capacity.   If you don't have one, please consider create one.      More information at: [email or web address]

## 2023-07-05 ENCOUNTER — Encounter: Payer: Self-pay | Admitting: Internal Medicine

## 2023-07-05 ENCOUNTER — Encounter (HOSPITAL_BASED_OUTPATIENT_CLINIC_OR_DEPARTMENT_OTHER): Payer: Self-pay

## 2023-07-05 ENCOUNTER — Emergency Department (HOSPITAL_BASED_OUTPATIENT_CLINIC_OR_DEPARTMENT_OTHER)
Admission: EM | Admit: 2023-07-05 | Discharge: 2023-07-05 | Disposition: A | Discharge status: Home / Self Care | Attending: Emergency Medicine | Admitting: Emergency Medicine

## 2023-07-05 ENCOUNTER — Other Ambulatory Visit: Payer: Self-pay

## 2023-07-05 DIAGNOSIS — W260XXA Contact with knife, initial encounter: Secondary | ICD-10-CM | POA: Diagnosis not present

## 2023-07-05 DIAGNOSIS — S61215A Laceration without foreign body of left ring finger without damage to nail, initial encounter: Secondary | ICD-10-CM | POA: Insufficient documentation

## 2023-07-05 DIAGNOSIS — S60945A Unspecified superficial injury of left ring finger, initial encounter: Secondary | ICD-10-CM | POA: Diagnosis present

## 2023-07-05 MED ORDER — ACETAMINOPHEN 325 MG PO TABS
650.0000 mg | ORAL_TABLET | Freq: Once | ORAL | Status: AC
  Administered 2023-07-05: 650 mg via ORAL
  Filled 2023-07-05: qty 2

## 2023-07-05 MED ORDER — LIDOCAINE HCL (PF) 1 % IJ SOLN
5.0000 mL | Freq: Once | INTRAMUSCULAR | Status: AC
  Administered 2023-07-05: 5 mL
  Filled 2023-07-05: qty 5

## 2023-07-05 NOTE — ED Triage Notes (Signed)
 PT coming in with small approximated laceration to left ring finger happening about an hour ago. Bleeding controlled. Pt states she was cutting an avocado when it happened.

## 2023-07-05 NOTE — Discharge Instructions (Signed)
 Please read and follow all provided instructions.  Your diagnoses today include:  1. Laceration of left ring finger without foreign body without damage to nail, initial encounter     Tests performed today include: Vital signs. See below for your results today.   Medications prescribed:  None  Take any prescribed medications only as directed.   Home care instructions:  Follow any educational materials and wound care instructions contained in this packet.   Keep affected area above the level of your heart when possible to minimize swelling. Wash area gently twice a day with warm soapy water. Do not apply alcohol or hydrogen peroxide. Cover the area if it draining or weeping.   Follow-up instructions: Suture Removal: Return to the Emergency Department or see your primary care care doctor in 10 days for a recheck of your wound and removal of your sutures or staples.    Return instructions:  Return to the Emergency Department if you have: Fever Worsening pain Worsening swelling of the wound Pus draining from the wound Redness of the skin that moves away from the wound, especially if it streaks away from the affected area  Any other emergent concerns  Your vital signs today were: BP 131/85 (BP Location: Right Arm)   Pulse 81   Temp 98.3 F (36.8 C)   Resp 16   Ht 5' 1.5" (1.562 m)   Wt 55.3 kg   SpO2 96%   BMI 22.68 kg/m  If your blood pressure (BP) was elevated above 135/85 this visit, please have this repeated by your doctor within one month. --------------

## 2023-07-05 NOTE — ED Provider Notes (Signed)
 [facility] [facility] AT [facility] Provider Note   CSN: [id number] Arrival date & time: 07/05/23  1751     History  Chief Complaint  Patient presents with   Laceration    Pamela Hansen is a 63 y.o. female.  Patient presents the emergency department for evaluation of left ring finger laceration.  Patient was cutting an avocado and sliced her finger about an hour prior to arrival.  Wound clean.  Tetanus up-to-date.  No weakness in the hand.  No distal numbness or tingling.  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       Home Medications Prior to Admission medications   Medication Sig Start Date End Date Taking? Authorizing Provider  ALPRAZolam (XANAX) 0.5 MG tablet TAKE 1/2 TABLET BY MOUTH EVERY DAY AS NEEDED FOR ANXIETY AND 1 TO 2 TABLETS EVERY NIGHT AT BEDTIME AS NEEDED FOR INSOMNIA 09/26/22   Wanda Plump, MD  Ascorbic Acid (VITAMIN C) 500 MG CAPS Take 500 mg by mouth daily.    [provider]  atorvastatin (LIPITOR) 20 MG tablet Take 1 tablet (20 mg total) by mouth at bedtime. 07/03/23   Wanda Plump, MD  Calcium Carbonate-Vitamin D 500-125 MG-UNIT TABS Take 1 tablet by mouth daily.    [provider]  Cholecalciferol (VITAMIN D3) 50 MCG (2000 UT) TABS Take 2,000 Units by mouth daily.    [provider]  Cyanocobalamin (B-12 PO) Take by mouth.    [provider]  cycloSPORINE (RESTASIS) 0.05 % ophthalmic emulsion 1 drop 2 (two) times daily.    [provider]  Gardiner Ramus, Angelica sinensis, (DONG QUAI PO) Take 2 capsules by mouth daily.    [provider]  fish oil-omega-3 fatty acids 1000 MG capsule Take 2 g by mouth daily.    [provider]  fluticasone (FLONASE) 50 MCG/ACT nasal spray USE 2 SPRAYS IN [facility] NOSTRIL DAILY 08/14/20   Wanda Plump, MD  Magnesium 100 MG TABS Take 100 mg by mouth daily.    [provider]  Menaquinone-7 (VITAMIN K2 PO) Take by mouth.    [provider]  vitamin E 180 MG (400 UNITS) capsule Take 400 Units by mouth daily.    [provider]      Allergies    Ibuprofen, Shrimp [shellfish allergy], Cetirizine hcl, Loratadine, and Tetanus toxoids    Review of Systems   Review of Systems  Physical Exam Updated Vital Signs BP 131/85 (BP Location: Right Arm)   Pulse 81   Temp 98.3 F (36.8 C)   Resp 16   Ht 5' 1.5" (1.562 m)   Wt 55.3 kg   SpO2 96%   BMI 22.68 kg/m  Physical Exam Vitals and nursing note reviewed.  Constitutional:      Appearance: She is well-developed.  HENT:     Head: Normocephalic and atraumatic.  Eyes:     Conjunctiva/sclera: Conjunctivae normal.  Pulmonary:     Effort: No respiratory distress.  Musculoskeletal:     Cervical back: Normal range of motion and neck supple.     Comments: Patient is able to flex and extend left ring finger at PIP, DIP, and MCP joints without difficulty.  Cap refill distally intact.  Skin:    General: Skin is warm and dry.     Comments: Left ring finger: 1 flap laceration of the left ring finger, volar surface, overlying the PIP joint crease.  Wound base is clean.  Mild bleeding after cleaning.  No tendon injury suspected.  Neurological:     Mental Status: She is alert.     ED Results / Procedures / Treatments   Labs (all labs ordered are listed, but only abnormal results are displayed) Labs Reviewed - No data to display  EKG None  Radiology No results found.  Procedures .Laceration Repair  Date/Time: 07/05/2023 7:30 PM  Performed by: Renne Crigler, PA-C Authorized by: Renne Crigler, PA-C   Consent:    Consent obtained:  Verbal   Consent given by:  Patient   Risks discussed:  Infection and pain Universal protocol:    Patient identity confirmed:  Verbally with patient and provided demographic data Anesthesia:    Anesthesia method:  Local infiltration   Local anesthetic:  Lidocaine 1% w/o epi Laceration details:    Location:   Finger   Finger location:  L ring finger   Length (cm):  1 Pre-procedure details:    Preparation:  Patient was prepped and draped in usual sterile fashion Exploration:    Hemostasis achieved with:  Direct pressure Treatment:    Area cleansed with:  Shur-Clens   Amount of cleaning:  Standard Skin repair:    Repair method:  Sutures   Suture size:  5-0   Suture material:  Nylon   Suture technique:  Simple interrupted   Number of sutures:  3 Approximation:    Approximation:  Close Repair type:    Repair type:  Simple Post-procedure details:    Dressing:  Open (no dressing)   Procedure completion:  Tolerated well, no immediate complications     Medications Ordered in ED Medications  acetaminophen (TYLENOL) tablet 650 mg (650 mg Oral Given 07/05/23 1901)  lidocaine (PF) (XYLOCAINE) 1 % injection 5 mL (5 mLs Infiltration Given by Other 07/05/23 1901)    ED Course/ Medical Decision Making/ A&P    Patient seen and examined. History obtained directly from patient.   Labs/EKG: None ordered  Imaging: None ordered  Medications/Fluids: Ordered: Lidocaine 1% without.   Most recent vital signs reviewed and are as follows: BP 131/85 (BP Location: Right Arm)   Pulse 81   Temp 98.3 F (36.8 C)   Resp 16   Ht 5' 1.5" (1.562 m)   Wt 55.3 kg   SpO2 96%   BMI 22.68 kg/m   Initial impression: Minor finger laceration from clean knife, tetanus up-to-date, no deep structure involvement suspected based on exam.  7:30 PM Reassessment performed. Patient appears comfortable. Exam unchanged.  Patient tolerated wound repair without any complications.  Most current vital signs reviewed and are as follows: BP 131/85 (BP Location: Right Arm)   Pulse 81   Temp 98.3 F (36.8 C)   Resp 16   Ht 5' 1.5" (1.562 m)   Wt 55.3 kg   SpO2 96%   BMI 22.68 kg/m   Plan: Discharge to home.   Prescriptions written for: None  Other home care instructions discussed: Patient counseled on wound care.     ED return instructions discussed: Patient was urged to return to the Emergency Department urgently with worsening pain, swelling, expanding erythema especially if it streaks away from the affected area, fever, or if they have any other concerns.   Follow-up instructions discussed: Patient counseled on need to return or see PCP/urgent care for suture removal in 10 days.  Medical Decision Making Risk OTC drugs. Prescription drug management.   Patient with shallow laceration from a knife cut.  Low concern for tendon injury.  Tetanus up-to-date.  Wound is clean and was able to be well cleaned in the ED.  Wound edges slightly separated.  Given location and proximity to joint crease, closure performed with sutures.  Patient tolerated well.  No negation for antibiotics at this time.        Final Clinical Impression(s) / ED Diagnoses Final diagnoses:  Laceration of left ring finger without foreign body without damage to nail, initial encounter    Rx / DC Orders ED Discharge Orders     None         Renne Crigler, PA-C 07/05/23 1932    Glyn Ade, MD 07/05/23 2238

## 2023-07-29 ENCOUNTER — Other Ambulatory Visit (INDEPENDENT_AMBULATORY_CARE_PROVIDER_SITE_OTHER)

## 2023-07-29 ENCOUNTER — Encounter: Payer: Self-pay | Admitting: Internal Medicine

## 2023-07-29 DIAGNOSIS — Z23 Encounter for immunization: Secondary | ICD-10-CM | POA: Diagnosis not present

## 2023-07-30 LAB — MEASLES/MUMPS/RUBELLA IMMUNITY
Mumps IgG: >300 [AU]/ml
Rubella: 1.32 {index}
Rubeola IgG: 25.1 [AU]/ml

## 2023-07-31 ENCOUNTER — Encounter: Payer: Self-pay | Admitting: Internal Medicine

## 2023-08-12 ENCOUNTER — Telehealth: Payer: Self-pay | Admitting: Internal Medicine

## 2023-08-12 NOTE — Telephone Encounter (Signed)
 Requesting: alprazolam 0.5mg   Contract: 06/20/22 UDS: 06/20/22 Last Visit: 07/03/23 Next Visit: 07/08/24 Last Refill: 09/26/22 #180 and 1RF  Please Advise

## 2023-08-13 NOTE — Telephone Encounter (Signed)
 PDMP okay, Rx sent

## 2023-10-15 ENCOUNTER — Encounter (HOSPITAL_BASED_OUTPATIENT_CLINIC_OR_DEPARTMENT_OTHER): Payer: Self-pay

## 2023-10-15 ENCOUNTER — Emergency Department (HOSPITAL_BASED_OUTPATIENT_CLINIC_OR_DEPARTMENT_OTHER)
Admission: EM | Admit: 2023-10-15 | Discharge: 2023-10-15 | Disposition: A | Discharge status: Home / Self Care | Attending: Emergency Medicine | Admitting: Emergency Medicine

## 2023-10-15 ENCOUNTER — Ambulatory Visit: Payer: Self-pay

## 2023-10-15 ENCOUNTER — Other Ambulatory Visit: Payer: Self-pay

## 2023-10-15 DIAGNOSIS — K644 Residual hemorrhoidal skin tags: Secondary | ICD-10-CM | POA: Insufficient documentation

## 2023-10-15 DIAGNOSIS — K625 Hemorrhage of anus and rectum: Secondary | ICD-10-CM | POA: Insufficient documentation

## 2023-10-15 LAB — OCCULT BLOOD X 1 CARD TO LAB, STOOL: Fecal Occult Bld: NEGATIVE

## 2023-10-15 NOTE — Telephone Encounter (Signed)
 Ok, thx

## 2023-10-15 NOTE — ED Triage Notes (Signed)
 Pt is deaf  Reads lips . Pt presents with complaint of blood in stools that began 5 weeks ago. She reports that she was drinking more frequently at night so she stopped drinking. Bleeding stopped then had a glass of wine and then noticed bleeding again. This was approx 5 weeks ago. Concerned due to episode of bright red blood in toilet this am. Unsure if possible hemorrhoids

## 2023-10-15 NOTE — ED Provider Notes (Signed)
 [place] [facility] Provider Note   CSN: [id number] Arrival date & time: 10/15/23  1134     Patient presents with: Rectal Bleeding   Pamela Hansen is a 63 y.o. female who is deaf and with history of hemorrhoids, presents with concern for bright red blood per rectum that occurred this morning.  States that she did have some bright red blood in her stools that occurred about 5 weeks ago, but when she dropped drinking alcohol, the bleeding stopped.  The episode this morning was not associated with any alcohol intake.  Denies any NSAID use.  Denies any constipation or straining when using the restroom.  Denies any black or tarry stools, lightheadedness, weakness, shortness of breath.  States she had a recent colonoscopy done 4 years ago which was unremarkable.    Rectal Bleeding      Prior to Admission medications   Medication Sig Start Date End Date Taking? Authorizing Provider  ALPRAZolam  (XANAX ) 0.5 MG tablet TAKE 1/2 TABLET EVERY DAY AS NEEDED FOR ANXIETY.. AND 1 TO 2 TABLETS EVERY NIGHT AT BEDTIME AS NEEDED FOR INSOMNIA 08/13/23   Paz, Jose E, MD  Ascorbic Acid (VITAMIN C) 500 MG CAPS Take 500 mg by mouth daily.    [provider]  atorvastatin  (LIPITOR) 20 MG tablet Take 1 tablet (20 mg total) by mouth at bedtime. 07/03/23   Ezell Hollow, MD  Calcium  Carbonate-Vitamin D  500-125 MG-UNIT TABS Take 1 tablet by mouth daily.    [provider]  Cholecalciferol (VITAMIN D3) 50 MCG (2000 UT) TABS Take 2,000 Units by mouth daily.    [provider]  Cyanocobalamin (B-12 PO) Take by mouth.    [provider]  cycloSPORINE (RESTASIS) 0.05 % ophthalmic emulsion 1 drop 2 (two) times daily.    [provider]  Leonetta Rama, Angelica sinensis, (DONG QUAI PO) Take 2 capsules by mouth daily.    [provider]  fish oil-omega-3 fatty acids 1000 MG capsule Take 2 g by mouth daily.    [provider]   fluticasone  (FLONASE ) 50 MCG/ACT nasal spray USE 2 SPRAYS IN EACH NOSTRIL DAILY 08/14/20   Paz, Jose E, MD  Magnesium 100 MG TABS Take 100 mg by mouth daily.    [provider]  Menaquinone-7 (VITAMIN K2 PO) Take by mouth.    [provider]  vitamin E 180 MG (400 UNITS) capsule Take 400 Units by mouth daily.    [provider]    Allergies: Ibuprofen, Shrimp [shellfish allergy], Cetirizine hcl, Loratadine, and Tetanus toxoids    Review of Systems  Gastrointestinal:  Positive for hematochezia.    Updated Vital Signs BP 115/72 (BP Location: Left Arm)   Pulse 83   Temp 98.2 F (36.8 C) (Oral)   Resp 17   Wt 54.4 kg   SpO2 99%   BMI 22.31 kg/m   Physical Exam Vitals and nursing note reviewed. Exam conducted with a chaperone present.  Constitutional:      Appearance: Normal appearance.  HENT:     Head: Atraumatic.   Cardiovascular:     Rate and Rhythm: Normal rate and regular rhythm.  Pulmonary:     Effort: Pulmonary effort is normal.  Abdominal:     Palpations: Abdomen is soft.     Tenderness: There is no abdominal tenderness.  Genitourinary:    Comments: RN Arletta Bender present to chaperon rectal exam   Patient with nonthrombosed external hemorrhoid.  No  active rectal bleeding.  Stool brown appearing, no melanotic appearing stool.   Neurological:     General: No focal deficit present.     Mental Status: She is alert.   Psychiatric:        Mood and Affect: Mood normal.        Behavior: Behavior normal.     (all labs ordered are listed, but only abnormal results are displayed) Labs Reviewed  OCCULT BLOOD X 1 CARD TO LAB, STOOL    EKG: None  Radiology: No results found.   Procedures   Medications Ordered in the ED - No data to display                                  Medical Decision Making Amount and/or Complexity of Data Reviewed Labs: ordered.     Differential diagnosis includes but is not limited to  hemorrhoid bleed, diverticulitis, upper GI bleed, malignancy, blood loss anemia  ED Course:  Upon initial evaluation, patient is well-appearing, no acute distress.  Stable vitals.  Reporting 1 episode of bright red blood per rectum when she had a bowel movement this morning.  On exam with RN chaperone present, patient with nonthrombosed external hemorrhoid present.  Stool brown appearing, no melanotic appearing stools.  No gross blood/active rectal bleeding.  Hemoccult negative.  Suspect her episode of bleeding might have been due to hemorrhoid bleeding.  She denies any dizziness, weakness, shortness of breath, no vital sign abnormalities, given only 1 episode, I have low concern for blood loss anemia.  No changes to bowel habits, no abdominal pain, no concern for other acute intra-abdominal pathology.  No indication for labs or imaging at this time. Feel she is stable for discharge at this time with close follow-up with her GI provider.  She states she will reach out to the GI provider she used for her colonoscopy for follow up   Impression: Bright red blood per rectum, likely hemorrhoid.  Disposition:  The patient was discharged home with instructions to use MiraLAX daily for the next week to help maintain soft stools.  Follow-up with her GI provider within the next 2 weeks for further management.  I have also provided contact information for [facility] if she is unable to follow-up with her normal GI provider.  She denies any rectal pain, indication for topical numbing cream at this time. Return precautions given.    Record Review: External records from outside source obtained and reviewed including GI colonoscopy by Dr. Hoyt Macleod where internal hemorrhoids were noted.     This chart was dictated using voice recognition software, Dragon. Despite the best efforts of this provider to proofread and correct errors, errors may still occur which can change documentation meaning.        Final diagnoses:  Rectal bleeding    ED Discharge Orders     None          Rexie Catena, [place] 10/15/23 1410    Jerilynn Montenegro, MD 10/15/23 1447

## 2023-10-15 NOTE — Telephone Encounter (Signed)
 FYI Only or Action Required?: FYI only for provider  Patient was last seen in primary care on 07/03/2023 by Pamela Hollow, MD. Called Nurse Triage reporting Blood In Stools and Rectal Bleeding. Symptoms began today. Interventions attempted: Nothing. Symptoms are: gradually worsening.  Triage Disposition: Go to ED Now (Notify PCP)  Patient/caregiver understands and will follow disposition?: Yes  Copied from CRM [id number]. Topic: Clinical - Red Word Triage >> Oct 15, 2023 10:43 AM Pamela Hansen wrote: Red Word that prompted transfer to Nurse Triage: Blood in stool   ----------------------------------------------------------------------- From previous Reason for Contact - Scheduling: Patient/patient representative is calling to schedule an appointment. Refer to attachments for appointment information. Reason for Disposition  [1] MODERATE rectal bleeding (small blood clots, passing blood without stool, or toilet water turns red) AND [2] more than once a day  Answer Assessment - Initial Assessment Questions 1. APPEARANCE of BLOOD: What color is it? Is it passed separately, on the surface of the stool, or mixed in with the stool?      Dark red, passed separately  2. AMOUNT: How much blood was passed?      Unsure of how much blood was passed 3. FREQUENCY: How many times has blood been passed with the stools?      1st time today since she stopped drinking five weeks ago. Patient states she has had episodes while she was drinking 4. ONSET: When was the blood first seen in the stools? (Days or weeks)      Occurred over a month ago initially 5. DIARRHEA: Is there also some diarrhea? If Yes, ask: How many diarrhea stools in the past 24 hours?      no 6. CONSTIPATION: Do you have constipation? If Yes, ask: How bad is it?     no 7. RECURRENT SYMPTOMS: Have you had blood in your stools before? If Yes, ask: When was the last time? and What happened that time?      Yes-last time was a  month ago 8. BLOOD THINNERS: Do you take any blood thinners? (e.g., Coumadin/warfarin, Pradaxa/dabigatran, aspirin)     no 9. OTHER SYMPTOMS: Do you have any other symptoms?  (e.g., abdomen pain, vomiting, dizziness, fever)     Abdominal tightening   Patient reports that she passed blood with tissue today. Patient denies abdominal pain but endorses tightness to her abdomen. Patient states that she has passed blood with stool as well as only blood. Patient endorses that today was dark red and concerning for her. Patient recommended to the ED  Protocols used: Rectal Bleeding-A-AH

## 2023-10-15 NOTE — Discharge Instructions (Addendum)
 On exam, you do have an external hemorrhoid that could be the cause of your bleeding.  You were also noted to have internal hemorrhoids on your recent colonoscopy, which could also be contributing to your bleeding.  There are no signs of an upper GI bleed today or acute blood loss that would warrant further examination emergently.  Avoid NSAID medications such as ibuprofen, naproxen, BC powders, etc. as these can worsen bleeding.  Please take 1 scoop of MiraLAX daily to help maintain soft bowel movements for the next 5 days.  This is an over-the-counter medication you can obtain at any drugstore.  Keeping your stools soft and not straining when using the restroom should help to prevent further episodes of bleeding.  Please follow-up with your GI provider within the next 2 weeks for further management.  It appears your colonoscopy was done with Dr. Hoyt Macleod.  If he is no longer available, you may follow-up with our GI providers, their contact information is listed below.  Please return to the emergency room if you develop any black stools, profuse rectal bleeding, feelings of dizziness, weakness, or shortness of breath, any other new or concerning symptoms.

## 2023-10-15 NOTE — Telephone Encounter (Signed)
 FYI. Pt going to ED for blood in stools

## 2023-12-09 ENCOUNTER — Encounter: Payer: Self-pay | Admitting: Gastroenterology

## 2023-12-09 ENCOUNTER — Ambulatory Visit: Admitting: Gastroenterology

## 2023-12-09 ENCOUNTER — Other Ambulatory Visit

## 2023-12-09 ENCOUNTER — Other Ambulatory Visit (INDEPENDENT_AMBULATORY_CARE_PROVIDER_SITE_OTHER)

## 2023-12-09 VITALS — BP 100/70 | HR 71 | Ht 61.0 in | Wt 124.0 lb

## 2023-12-09 DIAGNOSIS — K649 Unspecified hemorrhoids: Secondary | ICD-10-CM | POA: Diagnosis not present

## 2023-12-09 DIAGNOSIS — K648 Other hemorrhoids: Secondary | ICD-10-CM | POA: Diagnosis not present

## 2023-12-09 DIAGNOSIS — K625 Hemorrhage of anus and rectum: Secondary | ICD-10-CM

## 2023-12-09 LAB — COMPREHENSIVE METABOLIC PANEL WITH GFR
ALT: 11 U/L (ref 0–35)
AST: 14 U/L (ref 0–37)
Albumin: 4.5 g/dL (ref 3.5–5.2)
Alkaline Phosphatase: 39 U/L (ref 39–117)
BUN: 12 mg/dL (ref 6–23)
CO2: 33 meq/L — ABNORMAL HIGH (ref 19–32)
Calcium: 9.6 mg/dL (ref 8.4–10.5)
Chloride: 100 meq/L (ref 96–112)
Creatinine, Ser: 0.69 mg/dL (ref 0.40–1.20)
GFR: 90.99 mL/min (ref >60.00)
Glucose, Bld: 87 mg/dL (ref 70–99)
Potassium: 4.2 meq/L (ref 3.5–5.1)
Sodium: 139 meq/L (ref 135–145)
Total Bilirubin: 0.4 mg/dL (ref 0.2–1.2)
Total Protein: 7.1 g/dL (ref 6.0–8.3)

## 2023-12-09 LAB — CBC WITH DIFFERENTIAL/PLATELET
Basophils Absolute: 0 K/uL (ref 0.0–0.1)
Basophils Relative: 0.7 % (ref 0.0–3.0)
Eosinophils Absolute: 0.1 K/uL (ref 0.0–0.7)
Eosinophils Relative: 1.5 % (ref 0.0–5.0)
HCT: 42.1 % (ref 36.0–46.0)
Hemoglobin: 14.1 g/dL (ref 12.0–15.0)
Lymphocytes Relative: 40.6 % (ref 12.0–46.0)
Lymphs Abs: 2.2 K/uL (ref 0.7–4.0)
MCHC: 33.6 g/dL (ref 30.0–36.0)
MCV: 96.5 fl (ref 78.0–100.0)
Monocytes Absolute: 0.5 K/uL (ref 0.1–1.0)
Monocytes Relative: 9 % (ref 3.0–12.0)
Neutro Abs: 2.7 K/uL (ref 1.4–7.7)
Neutrophils Relative %: 48.2 % (ref 43.0–77.0)
Platelets: 230 K/uL (ref 150.0–400.0)
RBC: 4.36 Mil/uL (ref 3.87–5.11)
RDW: 12.6 % (ref 11.5–15.5)
WBC: 5.5 K/uL (ref 4.0–10.5)

## 2023-12-09 LAB — IBC + FERRITIN
Ferritin: 305.5 ng/mL — ABNORMAL HIGH (ref 10.0–291.0)
Iron: 154 ug/dL — ABNORMAL HIGH (ref 42–145)
Saturation Ratios: 57.3 % — ABNORMAL HIGH (ref 20.0–50.0)
TIBC: 268.8 ug/dL (ref 250.0–450.0)
Transferrin: 192 mg/dL — ABNORMAL LOW (ref 212.0–360.0)

## 2023-12-09 MED ORDER — NA SULFATE-K SULFATE-MG SULF 17.5-3.13-1.6 GM/177ML PO SOLN: 1.0000 | Freq: Once | ORAL | 0 refills | Status: AC

## 2023-12-09 NOTE — Patient Instructions (Addendum)
 Your provider has requested that you go to the basement level for lab work before leaving today. Press B on the elevator. The lab is located at the first door on the left as you exit the elevator.  You have been scheduled for a colonoscopy. Please follow written instructions given to you at your visit today.   If you use inhalers (even only as needed), please bring them with you on the day of your procedure.  DO NOT TAKE 7 DAYS PRIOR TO TEST- Trulicity (dulaglutide) Ozempic, Wegovy (semaglutide) Mounjaro (tirzepatide) Bydureon Bcise (exanatide extended release)  DO NOT TAKE 1 DAY PRIOR TO YOUR TEST Rybelsus (semaglutide) Adlyxin (lixisenatide) Victoza (liraglutide) Byetta (exanatide) ___________________________________________________________________________  _______________________________________________________  If your blood pressure at your visit was 140/90 or greater, please contact your primary care physician to follow up on this.  _______________________________________________________  If you are age 78 or older, your body mass index should be between 23-30. Your Body mass index is 23.43 kg/m. If this is out of the aforementioned range listed, please consider follow up with your Primary Care Provider.  If you are age 82 or younger, your body mass index should be between 19-25. Your Body mass index is 23.43 kg/m. If this is out of the aformentioned range listed, please consider follow up with your Primary Care Provider.   ________________________________________________________  The [facility] providers would like to encourage you to use MYCHART to communicate with providers for non-urgent requests or questions.  Due to long hold times on the telephone, sending your provider a message by [facility] may be a faster and more efficient way to get a response.  Please allow 48 business hours for a response.  Please remember that this is for non-urgent requests.   _______________________________________________________  [facility] is using a team-based approach to care.  Your team is made up of your doctor and two to three APPS. Our APPS (Nurse Practitioners and Physician Assistants) work with your physician to ensure care continuity for you. They are fully qualified to address your health concerns and develop a treatment plan. They communicate directly with your gastroenterologist to care for you. Seeing the Advanced Practice Practitioners on your physician's team can help you by facilitating care more promptly, often allowing for earlier appointments, access to diagnostic testing, procedures, and other specialty referrals.
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
## 2023-12-10 ENCOUNTER — Telehealth: Payer: Self-pay | Admitting: Gastroenterology

## 2023-12-10 NOTE — Telephone Encounter (Signed)
 Explained to patient that that the supplement Marino Call can have a mild increased bleeding risk and Camie would like her to hold this medication a few days prior to her colonoscopy on 01/08/24. Patient verbalized understanding.

## 2023-12-10 NOTE — Progress Notes (Signed)
 Attending Physician's Attestation   I have reviewed the chart.   I agree with the Advanced Practitioner's note, impression, and recommendations with any updates as below. Without clear etiology for bleeding, makes sense to move forward with endoscopic evaluation as outlined. If able to hold supplements Marino Call, for a few days before colonoscopy, and what appears to be some mild increased risk of bleeding.   Aloha Finner, MD  Gastroenterology Advanced Endoscopy Office # [phone]

## 2023-12-10 NOTE — Telephone Encounter (Signed)
 Patient has supplement Pamela Hansen listed in her medications which is associated with mild increased bleeding risk. Please ask patient to hold this for a few days prior to upcoming colonoscopy if possible, thank you.

## 2023-12-11 ENCOUNTER — Ambulatory Visit: Payer: Self-pay | Admitting: Gastroenterology

## 2023-12-11 DIAGNOSIS — R7989 Other specified abnormal findings of blood chemistry: Secondary | ICD-10-CM

## 2024-01-02 ENCOUNTER — Other Ambulatory Visit: Payer: Self-pay | Admitting: Medical Genetics

## 2024-01-08 ENCOUNTER — Ambulatory Visit: Admitting: Gastroenterology

## 2024-01-08 ENCOUNTER — Encounter: Payer: Self-pay | Admitting: Gastroenterology

## 2024-01-08 ENCOUNTER — Encounter: Admitting: Gastroenterology

## 2024-01-08 VITALS — BP 118/64 | HR 66 | Temp 98.1°F | Resp 13 | Ht 61.0 in | Wt 124.0 lb

## 2024-01-08 DIAGNOSIS — K641 Second degree hemorrhoids: Secondary | ICD-10-CM

## 2024-01-08 DIAGNOSIS — K921 Melena: Secondary | ICD-10-CM

## 2024-01-08 DIAGNOSIS — K644 Residual hemorrhoidal skin tags: Secondary | ICD-10-CM | POA: Diagnosis not present

## 2024-01-08 DIAGNOSIS — K5730 Diverticulosis of large intestine without perforation or abscess without bleeding: Secondary | ICD-10-CM

## 2024-01-08 DIAGNOSIS — R195 Other fecal abnormalities: Secondary | ICD-10-CM

## 2024-01-08 MED ORDER — SODIUM CHLORIDE 0.9 % IV SOLN: 500.0000 mL | Freq: Once | INTRAVENOUS | Status: DC

## 2024-01-08 NOTE — Progress Notes (Signed)
 Vss nad trans to pacu

## 2024-01-08 NOTE — Patient Instructions (Signed)
 Discharge instructions given. Handouts on Diverticulosis and Hemorrhoids. Resume previous medications. YOU HAD AN ENDOSCOPIC PROCEDURE TODAY AT THE [facility]:   Refer to the procedure report that was given to you for any specific questions about what was found during the examination.  If the procedure report does not answer your questions, please call your gastroenterologist to clarify.  If you requested that your care partner not be given the details of your procedure findings, then the procedure report has been included in a sealed envelope for you to review at your convenience later.  YOU SHOULD EXPECT: Some feelings of bloating in the abdomen. Passage of more gas than usual.  Walking can help get rid of the air that was put into your GI tract during the procedure and reduce the bloating. If you had a lower endoscopy (such as a colonoscopy or flexible sigmoidoscopy) you may notice spotting of blood in your stool or on the toilet paper. If you underwent a bowel prep for your procedure, you may not have a normal bowel movement for a few days.  Please Note:  You might notice some irritation and congestion in your nose or some drainage.  This is from the oxygen used during your procedure.  There is no need for concern and it should clear up in a day or so.  SYMPTOMS TO REPORT IMMEDIATELY:  Following lower endoscopy (colonoscopy or flexible sigmoidoscopy):  Excessive amounts of blood in the stool  Significant tenderness or worsening of abdominal pains  Swelling of the abdomen that is new, acute  Fever of 100F or higher   For urgent or emergent issues, a gastroenterologist can be reached at any hour by calling [phone]. Do not use MyChart messaging for urgent concerns.    DIET:  We do recommend a small meal at first, but then you may proceed to your regular diet.  Drink plenty of fluids but you should avoid alcoholic beverages for 24 hours.  ACTIVITY:  You should plan to  take it easy for the rest of today and you should NOT DRIVE or use heavy machinery until tomorrow (because of the sedation medicines used during the test).    FOLLOW UP: Our staff will call the number listed on your records the next business day following your procedure.  We will call around 7:15- 8:00 am to check on you and address any questions or concerns that you may have regarding the information given to you following your procedure. If we do not reach you, we will leave a message.     If any biopsies were taken you will be contacted by phone or by letter within the next 1-3 weeks.  Please call us  at [phone] if you have not heard about the biopsies in 3 weeks.    SIGNATURES/CONFIDENTIALITY: You and/or your care partner have signed paperwork which will be entered into your electronic medical record.  These signatures attest to the fact that that the information above on your After Visit Summary has been reviewed and is understood.  Full responsibility of the confidentiality of this discharge information lies with you and/or your care-partner.

## 2024-01-08 NOTE — Progress Notes (Signed)
 Updated medical record.
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
## 2024-01-08 NOTE — Op Note (Signed)
  [facility] Patient Name: Pamela Hansen Procedure Date: 01/08/2024 11:17 AM MRN: [id number] Endoscopist: Aloha Finner , MD, [id number] Age: 63 Referring MD:  Date of Birth: 08-16-1960 Gender: Female Account #: [id number] Procedure:                Colonoscopy Indications:              Hematochezia Medicines:                Monitored Anesthesia Care Procedure:                Pre-Anesthesia Assessment:                           - Prior to the procedure, a History and Physical                            was performed, and patient medications and                            allergies were reviewed. The patient's tolerance of                            previous anesthesia was also reviewed. The risks                            and benefits of the procedure and the sedation                            options and risks were discussed with the patient.                            All questions were answered, and informed consent                            was obtained. Prior Anticoagulants: The patient has                            taken no anticoagulant or antiplatelet agents. ASA                            Grade Assessment: II - A patient with mild systemic                            disease. After reviewing the risks and benefits,                            the patient was deemed in satisfactory condition to                            undergo the procedure.                           After obtaining informed consent, the colonoscope  was passed under direct vision. Throughout the                            procedure, the patient's blood pressure, pulse, and                            oxygen saturations were monitored continuously. The                            Olympus Scope [id number] was introduced through the                            anus and advanced to the 3 cm into the ileum. The                            colonoscopy was performed without  difficulty. The                            patient tolerated the procedure. The quality of the                            bowel preparation was good. The terminal ileum,                            ileocecal valve, appendiceal orifice, and rectum                            were photographed. Scope In: 11:32:40 AM Scope Out: 11:45:41 AM Scope Withdrawal Time: 0 hours 6 minutes 45 seconds  Total Procedure Duration: 0 hours 13 minutes 1 second  Findings:                 Skin tags were found on perianal exam.                           The digital rectal exam findings include                            hemorrhoids. Pertinent negatives include no                            palpable rectal lesions.                           The terminal ileum and ileocecal valve appeared                            normal.                           Multiple small-mouthed diverticula were found in                            the recto-sigmoid colon and sigmoid colon.  Normal mucosa was found in the entire colon.                           Non-bleeding non-thrombosed external and internal                            hemorrhoids were found during retroflexion, during                            perianal exam and during digital exam. The                            hemorrhoids were Grade II (internal hemorrhoids                            that prolapse but reduce spontaneously). Complications:            No immediate complications. Estimated Blood Loss:     Estimated blood loss was minimal. Estimated blood                            loss: none. Impression:               - Perianal skin tags found on perianal exam.                           - Hemorrhoids found on digital rectal exam.                           - The examined portion of the ileum was normal.                           - Diverticulosis in the recto-sigmoid colon and in                            the sigmoid colon.                            - Normal mucosa in the entire examined colon.                           - Non-bleeding non-thrombosed external and internal                            hemorrhoids. Recommendation:           - The patient will be observed post-procedure,                            until all discharge criteria are met.                           - Discharge patient to home.                           - Patient has a contact number available for  emergencies. The signs and symptoms of potential                            delayed complications were discussed with the                            patient. Return to normal activities tomorrow.                            Written discharge instructions were provided to the                            patient.                           - High fiber diet.                           - Use FiberCon 1-2 tablets PO daily.                           - Continue present medications.                           - Consider Anusol  suppositories in future if                            needed. Can consider hemorrhoidal banding in future                            as well if bleeding becomes more significant.                           - Repeat colonoscopy in 10 years for screening                            purposes.                           - The findings and recommendations were discussed                            with the patient. Aloha Finner, MD 01/08/2024 11:50:06 AM

## 2024-01-11 ENCOUNTER — Telehealth: Payer: Self-pay

## 2024-01-11 NOTE — Telephone Encounter (Signed)
 Attempted f/u call. No answer, left VM.

## 2024-01-14 ENCOUNTER — Other Ambulatory Visit (INDEPENDENT_AMBULATORY_CARE_PROVIDER_SITE_OTHER)

## 2024-01-14 DIAGNOSIS — R7989 Other specified abnormal findings of blood chemistry: Secondary | ICD-10-CM | POA: Diagnosis not present

## 2024-01-14 LAB — IBC + FERRITIN
Ferritin: 251.5 ng/mL (ref 10.0–291.0)
Iron: 140 ug/dL (ref 42–145)
Saturation Ratios: 54.6 % — ABNORMAL HIGH (ref 20.0–50.0)
TIBC: 256.2 ug/dL (ref 250.0–450.0)
Transferrin: 183 mg/dL — ABNORMAL LOW (ref 212.0–360.0)

## 2024-01-18 ENCOUNTER — Ambulatory Visit: Payer: Self-pay | Admitting: Gastroenterology

## 2024-01-19 ENCOUNTER — Other Ambulatory Visit: Payer: Self-pay

## 2024-01-19 DIAGNOSIS — R7989 Other specified abnormal findings of blood chemistry: Secondary | ICD-10-CM

## 2024-01-19 DIAGNOSIS — Z006 Encounter for examination for normal comparison and control in clinical research program: Secondary | ICD-10-CM

## 2024-01-20 ENCOUNTER — Other Ambulatory Visit (INDEPENDENT_AMBULATORY_CARE_PROVIDER_SITE_OTHER)

## 2024-01-20 DIAGNOSIS — R7989 Other specified abnormal findings of blood chemistry: Secondary | ICD-10-CM

## 2024-01-20 LAB — IBC + FERRITIN
Ferritin: 252.4 ng/mL (ref 10.0–291.0)
Iron: 66 ug/dL (ref 42–145)
Saturation Ratios: 25.9 % (ref 20.0–50.0)
TIBC: 254.8 ug/dL (ref 250.0–450.0)
Transferrin: 182 mg/dL — ABNORMAL LOW (ref 212.0–360.0)

## 2024-01-20 LAB — CBC WITH DIFFERENTIAL/PLATELET
Basophils Absolute: 0.1 K/uL (ref 0.0–0.1)
Basophils Relative: 0.9 % (ref 0.0–3.0)
Eosinophils Absolute: 0.1 K/uL (ref 0.0–0.7)
Eosinophils Relative: 1.4 % (ref 0.0–5.0)
HCT: 39.2 % (ref 36.0–46.0)
Hemoglobin: 13 g/dL (ref 12.0–15.0)
Lymphocytes Relative: 32.3 % (ref 12.0–46.0)
Lymphs Abs: 2 K/uL (ref 0.7–4.0)
MCHC: 33.1 g/dL (ref 30.0–36.0)
MCV: 96.8 fl (ref 78.0–100.0)
Monocytes Absolute: 0.5 K/uL (ref 0.1–1.0)
Monocytes Relative: 8.6 % (ref 3.0–12.0)
Neutro Abs: 3.4 K/uL (ref 1.4–7.7)
Neutrophils Relative %: 56.8 % (ref 43.0–77.0)
Platelets: 193 K/uL (ref 150.0–400.0)
RBC: 4.05 Mil/uL (ref 3.87–5.11)
RDW: 13.6 % (ref 11.5–15.5)
WBC: 6.1 K/uL (ref 4.0–10.5)

## 2024-01-21 ENCOUNTER — Ambulatory Visit: Payer: Self-pay | Admitting: Gastroenterology

## 2024-01-22 ENCOUNTER — Ambulatory Visit (HOSPITAL_COMMUNITY)
Admission: RE | Admit: 2024-01-22 | Discharge: 2024-01-22 | Disposition: A | Discharge status: Home / Self Care | Source: Ambulatory Visit | Attending: Gastroenterology | Admitting: Gastroenterology

## 2024-01-22 DIAGNOSIS — R7989 Other specified abnormal findings of blood chemistry: Secondary | ICD-10-CM | POA: Diagnosis present

## 2024-01-26 ENCOUNTER — Ambulatory Visit: Payer: Self-pay | Admitting: *Deleted

## 2024-01-26 NOTE — Telephone Encounter (Signed)
 FYI Only or Action Required?: Action required by provider: clinical question for provider. Please advise if patient should stop taking statin until evaluation  Patient was last seen in primary care on 07/03/2023 by Amon Aloysius BRAVO, MD.  Called Nurse Triage reporting Pain.  Symptoms began about a month ago.  Interventions attempted: Rest, hydration, or home remedies.  Symptoms are: gradually worsening.  Triage Disposition: See PCP When Office is Open (Within 3 Days)  Patient/caregiver understands and will follow disposition?: Yes             Copied from CRM #[id number]. Topic: Clinical - Red Word Triage >> Jan 26, 2024 11:31 AM Mia F wrote: Red Word that prompted transfer to Nurse Triage: Legs, hips, and feet hurt. They are mostly aching and burning. Woke up to pain the left shin. Has had an ultrasond done by Dr Wilhelmenia but results are not in yet. Pt says pain is worsening. Reason for Disposition  [1] MODERATE pain (e.g., interferes with normal activities, limping) AND [2] present > 3 days  Answer Assessment - Initial Assessment Questions Appt scheduled tomorrow with other provider due to none available with PCP until 02/02/24. Please advise regarding taking statin and worsening aches in feet, legs, hips. Recommended patient to stop lipitor until f/u with provider tomorrow.       1. ONSET: When did the pain start?      1 month and progressively worse 2. LOCATION: Where is the pain located?      Legs, feet, hips, knees and left shin. 3. PAIN: How bad is the pain?    (Scale 1-10; or mild, moderate, severe)     No pain now but comes and goes with mobility  4. WORK OR EXERCISE: Has there been any recent work or exercise that involved this part of the body?      na 5. CAUSE: What do you think is causing the leg pain?     Not sure  6. OTHER SYMPTOMS: Do you have any other symptoms? (e.g., chest pain, back pain, breathing difficulty, swelling, rash, fever, numbness,  weakness)     Burning ache pain legs hips. Hips feel like something joint hurts with walking  Burning top of feet tingling . Concerned high levels of iron in blood. Extreme fatigue 7. PREGNANCY: Is there any chance you are pregnant? When was your last menstrual period?     na  Protocols used: Leg Pain-A-AH

## 2024-01-27 ENCOUNTER — Ambulatory Visit: Payer: Self-pay | Admitting: Family Medicine

## 2024-01-27 ENCOUNTER — Encounter: Payer: Self-pay | Admitting: Family Medicine

## 2024-01-27 ENCOUNTER — Ambulatory Visit: Admitting: Family Medicine

## 2024-01-27 VITALS — BP 100/60 | HR 53 | Temp 98.1°F | Resp 18 | Ht 61.0 in | Wt 125.2 lb

## 2024-01-27 DIAGNOSIS — M25552 Pain in left hip: Secondary | ICD-10-CM | POA: Diagnosis not present

## 2024-01-27 DIAGNOSIS — M898X6 Other specified disorders of bone, lower leg: Secondary | ICD-10-CM | POA: Diagnosis not present

## 2024-01-27 DIAGNOSIS — M25551 Pain in right hip: Secondary | ICD-10-CM | POA: Diagnosis not present

## 2024-01-27 LAB — IBC + FERRITIN
Ferritin: 250.5 ng/mL (ref 10.0–291.0)
Iron: 125 ug/dL (ref 42–145)
Saturation Ratios: 46.5 % (ref 20.0–50.0)
TIBC: 268.8 ug/dL (ref 250.0–450.0)
Transferrin: 192 mg/dL — ABNORMAL LOW (ref 212.0–360.0)

## 2024-01-27 LAB — URIC ACID: Uric Acid, Serum: 3.8 mg/dL (ref 2.4–7.0)

## 2024-01-27 LAB — VITAMIN D 25 HYDROXY (VIT D DEFICIENCY, FRACTURES): VITD: 34.51 ng/mL (ref 30.00–100.00)

## 2024-01-27 NOTE — Patient Instructions (Signed)
 Give us  2-3 business days to get the results of your labs back.   Stay hydrated.  Please stop your statin for 2 weeks and send me a message with an update.   OK to take Tylenol  1000 mg (2 extra strength tabs) or 975 mg (3 regular strength tabs) every 6 hours as needed.  Heat (pad or rice pillow in microwave) over affected area, 10-15 minutes twice daily.   Ice/cold pack over area for 10-15 min twice daily.  Let us  know if you need anything.  Hip Exercises It is normal to feel mild stretching, pulling, tightness, or discomfort as you do these exercises, but you should stop right away if you feel sudden pain or your pain gets worse.   STRETCHING AND RANGE OF MOTION EXERCISES These exercises warm up your muscles and joints and improve the movement and flexibility of your hip. These exercises also help to relieve pain, numbness, and tingling. Exercise A: Hamstrings, Supine  Lie on your back. Loop a belt or towel over the ball of your left / right foot. The ball of your foot is on the walking surface, right under your toes. Straighten your left / right knee and slowly pull on the belt to raise your leg. Do not let your left / right knee bend while you do this. Keep your other leg flat on the floor. Raise the left / right leg until you feel a gentle stretch behind your left / right knee or thigh. Hold this position for 30 seconds. Slowly return your leg to the starting position. Repeat2 times. Complete this stretch 3 times per week. Exercise B: Hip Rotators  Lie on your back on a firm surface. Hold your left / right knee with your left / right hand. Hold your ankle with your other hand. Gently pull your left / right knee and rotate your lower leg toward your other shoulder. Pull until you feel a stretch in your buttocks. Keep your hips and shoulders firmly planted while you do this stretch. Hold this position for 30 seconds. Repeat 2 times. Complete this stretch 3 times per  week. Exercise C: V-Sit (Hamstrings and Adductors)  Sit on the floor with your legs extended in a large "V" shape. Keep your knees straight during this exercise. Start with your head and chest upright, then bend at your waist to reach for your left foot (position A). You should feel a stretch in your right inner thigh. Hold this position for 30 seconds. Then slowly return to the upright position. Bend at your waist to reach forward (position B). You should feel a stretch behind both of your thighs and knees. Hold this position for 30 seconds. Then slowly return to the upright position. Bend at your waist to reach for your right foot (position C). You should feel a stretch in your left inner thigh. Hold this position for 30 seconds. Then slowly return to the upright position. Repeat A, B, and C 2 times each. Complete this stretch 3 times per week. Exercise D: Lunge (Hip Flexors)  Place your left / right knee on the floor and bend your other knee so that is directly over your ankle. You should be half-kneeling. Keep good posture with your head over your shoulders. Tighten your buttocks to point your tailbone downward. This helps your back to keep from arching too much. You should feel a gentle stretch in the front of your left / right thigh and hip. If you do not feel any resistance,  slightly slide your other foot forward and then slowly lunge forward so your knee once again lines up over your ankle. Make sure your tailbone continues to point downward. Hold this position for 30 seconds. Repeat 2 times. Complete this stretch 3 times per week.  STRENGTHENING EXERCISES These exercises build strength and endurance in your hip. Endurance is the ability to use your muscles for a long time, even after they get tired. Exercise E: Bridge (Hip Extensors)  Lie on your back on a firm surface with your knees bent and your feet flat on the floor. Tighten your buttocks muscles and lift your bottom off the  floor until the trunk of your body is level with your thighs. Do not arch your back. You should feel the muscles working in your buttocks and the back of your thighs. If you do not feel these muscles, slide your feet 1-2 inches (2.5-5 cm) farther away from your buttocks. Hold this position for 3 seconds. Slowly lower your hips to the starting position. Repeat for a total of 10 repetitions. Let your muscles relax completely between repetitions. If this exercise is too easy, try doing it with your arms crossed over your chest. Repeat 2 times. Complete this exercise 3 times per week. Exercise F: Straight Leg Raises - Hip Abductors  Lie on your side with your left / right leg in the top position. Lie so your head, shoulder, knee, and hip line up with each other. You may bend your bottom knee to help you balance. Roll your hips slightly forward, so your hips are stacked directly over each other and your left / right knee is facing forward. Leading with your heel, lift your top leg 4-6 inches (10-15 cm). You should feel the muscles in your outer hip lifting. Do not let your foot drift forward. Do not let your knee roll toward the ceiling. Hold this position for 1 second. Slowly return to the starting position. Let your muscles relax completely between repetitions. Repeat for a total of 10 repetitions.  Repeat 2 times. Complete this exercise 3 times per week. Exercise G: Straight Leg Raises - Hip Adductors  Lie on your side with your left / right leg in the bottom position. Lie so your head, shoulder, knee, and hip line up. You may place your upper foot in front to help you balance. Roll your hips slightly forward, so your hips are stacked directly over each other and your left / right knee is facing forward. Tense the muscles in your inner thigh and lift your bottom leg 4-6 inches (10-15 cm). Hold this position for 1 second. Slowly return to the starting position. Let your muscles relax  completely between repetitions. Repeat for a total of 10 repetitions. Repeat 2 times. Complete this exercise 3 times per week. Exercise H: Straight Leg Raises - Quadriceps  Lie on your back with your left / right leg extended and your other knee bent. Tense the muscles in the front of your left / right thigh. When you do this, you should see your kneecap slide up or see increased dimpling just above your knee. Tighten these muscles even more and raise your leg 4-6 inches (10-15 cm) off the floor. Hold this position for 3 seconds. Keep these muscles tense as you lower your leg. Relax the muscles slowly and completely between repetitions. Repeat for a total of 10 repetitions. Repeat 2 times. Complete this exercise 3 times per week. Exercise I: Hip Abductors, Standing Tie one end of  a rubber exercise band or tubing to a secure surface, such as a table or pole. Loop the other end of the band or tubing around your left / right ankle. Keeping your ankle with the band or tubing directly opposite of the secured end, step away until there is tension in the tubing or band. Hold onto a chair as needed for balance. Lift your left / right leg out to your side. While you do this: Keep your back upright. Keep your shoulders over your hips. Keep your toes pointing forward. Make sure to use your hip muscles to lift your leg. Do not throw your leg or tip your body to lift your leg. Hold this position for 1 second. Slowly return to the starting position. Repeat for a total of 10 repetitions. Repeat 2 times. Complete this exercise 3 times per week. Exercise J: Squats (Quadriceps) Stand in a door frame so your feet and knees are in line with the frame. You may place your hands on the frame for balance. Slowly bend your knees and lower your hips like you are going to sit in a chair. Keep your lower legs in a straight-up-and-down position. Do not let your hips go lower than your knees. Do not bend your knees  lower than told by your health care provider. If your hip pain increases, do not bend as low. Hold this position for 1 second. Slowly push with your legs to return to standing. Do not use your hands to pull yourself to standing. Repeat for a total of 10 repetitions. Repeat 2 times. Complete this exercise 3 times per week. Make sure you discuss any questions you have with your health care provider. Document Released: 05/09/2005 Document Revised: 01/14/2016 Document Reviewed: 04/16/2015 Elsevier Interactive Patient Education  [employer].

## 2024-01-27 NOTE — Progress Notes (Signed)
 Musculoskeletal Exam  Patient: Pamela Hansen DOB: June 25, 1960  DOS: 01/27/2024  SUBJECTIVE:  Chief Complaint:   Chief Complaint  Patient presents with   Pain   Fatigue    Pamela Hansen is a 63 y.o.  female for evaluation and treatment of hip, knee and foot pain.   Onset:  2 months ago. No inj or change in activity.  Location: knees, front of hips, top of both feet Character:  burning on the tops of her feet; achiness over knees and hips Progression of issue:  waxes and wanes, seems ot be better in the AM Associated symptoms: sometimes gives out (hip) on the L  No bruising, redness, swelling Treatment: to date has been none.   Neurovascular symptoms: no  Past Medical History:  Diagnosis Date   Back pain    local injections 2012    Closed fracture of right distal radius 2018   Deafness    Depression    Dry eye syndrome    on restasis and fish oil   Granuloma annulare    Menopause    Ovarian cyst    Palpable abd. aorta 02/25/2010    Objective: VITAL SIGNS: BP 100/60   Pulse (!) 53   Temp 98.1 F (36.7 C)   Resp 18   Ht 5' 1 (1.549 m)   Wt 125 lb 3.2 oz (56.8 kg)   SpO2 96%   BMI 23.66 kg/m  Constitutional: Well formed, well developed. No acute distress. Thorax & Lungs: No accessory muscle use Musculoskeletal: hipos/knees/feet.   Normal active range of motion: yes.   Normal passive range of motion: yes Tenderness to palpation: yes- over b/l hip flexors, worse on R; TTP over the distal medial tibia bilaterally Deformity: no Ecchymosis: no Tests positive: Stinchfield b/l Tests negative: Logroll, FABER, FADIR, patellar apprehension/grind, Lachman's, McMurray's, Steinmann's, varus/valgus stress, squeeze, anterior drawer Neurologic: Normal sensory function. No focal deficits noted. DTR's equal and symmetric in LE's. No clonus.  Gait is normal. Psychiatric: Normal mood. Age appropriate judgment and insight. Alert & oriented x 3.    Assessment:  Bilateral  hip pain - Plan: Uric acid  Iron overload - Plan: IBC + Ferritin  Tibial pain - Plan: VITAMIN D  25 Hydroxy (Vit-D Deficiency, Fractures)  Plan: Stretches/exercises, heat, ice, Tylenol . F/u on Fe at pt's request. Ck Vit D and urate. AI seems less likely tho. There is a reasonable likelihood that her pain is 2/2 AE of chronic med Lipitor. She will hold this for 2 weeks and update me with how she is doing. If that is the case where she is not better, will have her restart and we will set her up with the sports med team. If it is better, we will consider changing to hydrophilic statin vs restarting. Stay hydrated.  F/u pending above. The patient voiced understanding and agreement to the plan.   Mabel Mt [place], DO 01/27/24  10:56 AM

## 2024-01-29 LAB — GENECONNECT MOLECULAR SCREEN: Genetic Analysis Overall Interpretation: NEGATIVE

## 2024-02-08 ENCOUNTER — Ambulatory Visit: Admitting: Gastroenterology

## 2024-02-23 ENCOUNTER — Ambulatory Visit: Admitting: Gastroenterology

## 2024-04-17 ENCOUNTER — Ambulatory Visit
Admission: EM | Admit: 2024-04-17 | Discharge: 2024-04-17 | Disposition: A | Discharge status: Home / Self Care | Attending: Family Medicine | Admitting: Family Medicine

## 2024-04-17 DIAGNOSIS — H1031 Unspecified acute conjunctivitis, right eye: Secondary | ICD-10-CM

## 2024-04-17 MED ORDER — OFLOXACIN 0.3 % OP SOLN: 1.0000 [drp] | Freq: Four times a day (QID) | OPHTHALMIC | 0 refills | Status: AC

## 2024-04-17 NOTE — ED Notes (Signed)
 Of note, patient is hard of hearing

## 2024-04-17 NOTE — Discharge Instructions (Signed)
 Start antibiotic eyedrops as prescribed.  If you do not have any improvement after 2 days you may stop this and start over-the-counter allergy eyedrops such as Clear Eyes.  Warm compresses to the eye as needed.  Follow-up with your PCP if your symptoms do not improve.  Please go to the emergency room for any worsening symptoms.  Hope you feel better soon!
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
## 2024-04-17 NOTE — ED Triage Notes (Signed)
 Pt prsents to UC for c/o tearing, burning of right eye x3 days. Pt reports she noticed pus from the eye today. She states she has blurred vision from the drainage. She has a history of dry eyes and is on daily ristasis. Sclera of right eye is inflamed.

## 2024-05-09 ENCOUNTER — Ambulatory Visit: Payer: Self-pay

## 2024-05-09 NOTE — Telephone Encounter (Signed)
 FYI Only or Action Required?: FYI only for provider: appointment scheduled on 05/10/24.  Patient was last seen in primary care on 01/27/2024 by Frann Mabel Mt, DO.  Called Nurse Triage reporting Hip Pain.  Symptoms began several days ago.  Interventions attempted: OTC medications: Tylenol  with relief for a couple of hours.  Symptoms are: unchanged.  Triage Disposition: See PCP When Office is Open (Within 3 Days)  Patient/caregiver understands and will follow disposition?: Yes                                  1. LOCATION and RADIATION: Where is the pain located? Does the pain spread (shoot) anywhere else?     Left hip  2. QUALITY: What does the pain feel like?  (e.g., sharp, dull, aching, burning)     Sharp and sudden  3. SEVERITY: How bad is the pain? What does it keep you from doing?   (Scale 1-10; or mild, moderate, severe)     Rates pain 8-10 4. ONSET: When did the pain start? Does it come and go, or is it there all the time?     Friday or Saturday  6. CAUSE: What do you think is causing the hip pain?      Unsure, states she was walking this past weekend and heard a crack 7. AGGRAVATING FACTORS: What makes the hip pain worse? (e.g., walking, climbing stairs, running)     Turning in bed, putting on socks, sitting and standing 8. OTHER SYMPTOMS: Do you have any other symptoms? (e.g., back pain, pain shooting down leg,  fever, rash)     Reports she is able to bear weight when she is on both feet, reports she is able to walk, but is having to take shorter strides Denies redness, denies swelling, denies numbness  This RN advised in-person evaluation. This RN scheduled patient for first available appointment in office.   Reason for Disposition  [1] MODERATE pain (e.g., interferes with normal activities, limping) AND [2] present > 3 days  Protocols used: Hip Pain-A-AH  Copied from CRM #[id number]. Topic: Clinical - Red Word  Triage >> May 09, 2024  3:13 PM Taleah C wrote: Red Word that prompted transfer to Nurse Triage: extreme pain in hips, felt an aggressive crack in hips while walking. Happened over the weekend.  Very slight hip pain at baseline for a few months Visiting daughter past weekend, walking and felt her hip crack

## 2024-05-09 NOTE — Telephone Encounter (Signed)
"  Appt scheduled  "

## 2024-05-10 ENCOUNTER — Encounter: Payer: Self-pay | Admitting: Family Medicine

## 2024-05-10 ENCOUNTER — Ambulatory Visit: Admitting: Family Medicine

## 2024-05-10 ENCOUNTER — Ambulatory Visit (HOSPITAL_BASED_OUTPATIENT_CLINIC_OR_DEPARTMENT_OTHER)
Admission: RE | Admit: 2024-05-10 | Discharge: 2024-05-10 | Disposition: A | Discharge status: Home / Self Care | Source: Ambulatory Visit | Attending: Family Medicine | Admitting: Family Medicine

## 2024-05-10 VITALS — BP 119/51 | HR 74 | Ht 61.0 in | Wt 127.0 lb

## 2024-05-10 DIAGNOSIS — M25552 Pain in left hip: Secondary | ICD-10-CM | POA: Diagnosis present
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
## 2024-05-11 ENCOUNTER — Ambulatory Visit

## 2024-05-11 VITALS — BP 110/76 | Ht 61.0 in | Wt 127.0 lb

## 2024-05-11 DIAGNOSIS — M24152 Other articular cartilage disorders, left hip: Secondary | ICD-10-CM | POA: Diagnosis not present

## 2024-05-11 DIAGNOSIS — M25852 Other specified joint disorders, left hip: Secondary | ICD-10-CM | POA: Diagnosis not present

## 2024-05-11 DIAGNOSIS — M1612 Unilateral primary osteoarthritis, left hip: Secondary | ICD-10-CM | POA: Diagnosis not present

## 2024-05-11 DIAGNOSIS — M25552 Pain in left hip: Secondary | ICD-10-CM

## 2024-05-11 NOTE — Progress Notes (Signed)
 "  Subjective:    Patient ID: Pamela Hansen, female    DOB: 64 y.o., 17-Mar-1961   MRN: [id number]  Chief Complaint: Left hip pain  Discussed the use of AI scribe software for clinical note transcription with the patient, who gave verbal consent to proceed.  History of Present Illness Pamela Hansen is a 64 year old female with past medical history significant for anxiety and depression presenting for evaluation of left hip pain.  Per 05/10/2024 primary care office visit note: She has been experiencing stiffness in her hips, which became particularly noticeable around New Year's Eve. She recalls difficulty going up and down stairs during a visit to [place] , requiring her to hold onto the handrail, which was unusual for her. Previously, she had no issues carrying boxes up and down stairs after Thanksgiving.   The pain is described as aching, particularly when sitting, and she notes a 'crack' sensation in her hip while walking or using stairs at her daughter's house. The pain is deep and not associated with any external bruising. Walking and certain movements exacerbate the pain, although she does not have stairs at home   Pamela Hansen is a 64 year old female with remote pelvic fracture who presents with acute left hip pain.  Left hip pain - Severe left hip pain onset around New Year's Eve, gradually improving but remains significant - Pain localized to the left hip with occasional mild discomfort in either hip - Pain sometimes exacerbated by weather changes - Cracking sensation in the left hip last Friday while standing or walking, coinciding with increased pain - No numbness or tingling in the thigh - No prior hip surgery or major hip injury apart from pelvic fracture forty years ago  Functional impairment - Marked difficulty with ambulation, including stairs, getting in and out of the car, and putting on socks - Limited to walking two blocks - Unable to walk her dog long distances or carry  boxes on stairs - Requires use of both legs to assist with mobility  History of falls - Several falls in the past two years related to her large dog - One fall led to hip x-rays that showed no fracture - Pain from these falls resolved without treatment  Dermatologic symptoms - Currently has scabies   Review of Pertinent Imaging: 3 view plain film radiographs obtained of the left hip on 05/10/2024 per my independent review revealing diminished cut away of the femoral neck, mild osteophytic change noted at around the peripheral edges of the acetabular rim.  Mild joint space narrowing of the femoroacetabular joint.  No acute osseous abnormalities appreciable.    Objective:   Vitals:   05/11/24 1051  BP: 110/76   Left hip (compared to normal) -Inspection: No swelling or skin changes. No leg length discrepancy.  Antalgic gait; favors right leg. -Palpation: TTP - level ASIS, - level AIIS, - adductor, + greater trochanter, + SI joint, - over piriformis -AROM/PROM: Flexion 115 deg, abduction 30  deg, adduction 20  deg, extension 10 deg, ER 60 deg, IR 20 deg, normal  hamstring flexibility -Strength: 5/5 flexion (though limited 2/2 pain when this is resisted), 5/5 abduction, 5/5 adduction, 5/5 extension, unable to tolerate single-leg squat. -Special tests: +  FADIR (pain elicited in the groin), equivocal log roll, - Thomas, - Maylene, - Noble, + FABER (pain elicited in anterior hip.), - piriformis testing, - SLR, unable to tolerate hop test     Assessment & Plan:   Assessment & Plan Left  hip osteoarthritis with labral injury and gluteal tendinopathy She presents with acute-on-chronic left hip pain, recently worsened by a possible mechanical event. The symptoms suggest intra-articular pathology, likely osteoarthritis and labral injury, with additional gluteal tendinopathy. Femoral neck morphology may contribute to her symptoms, but no acute fracture is seen on x-ray. Her mobility and daily function  are significantly limited. The primary pain source is suspected to be intra-articular. Diagnosis and contributing factors, including femoral neck morphology, were discussed. X-ray findings were reviewed, and final radiology interpretation is awaited. An intra-articular hip injection under ultrasound guidance was offered as a diagnostic and therapeutic intervention, with anticipated rapid relief if intra-articular pathology predominates. Risks and post-procedure care were reviewed. Alternative therapies, such as oral anti-inflammatory medication and physical therapy, were discussed but are less likely to provide significant relief. She was instructed to schedule the hip injection at the earliest available appointment and given post-injection instructions: avoid soaking the injection site for 24 hours, with no significant activity restrictions, and may drive as tolerated. She will be updated if final radiology findings differ from the initial assessment.   "

## 2024-05-12 ENCOUNTER — Ambulatory Visit

## 2024-05-12 ENCOUNTER — Other Ambulatory Visit: Payer: Self-pay

## 2024-05-12 VITALS — BP 100/72 | Ht 61.0 in | Wt 127.0 lb

## 2024-05-12 DIAGNOSIS — M24152 Other articular cartilage disorders, left hip: Secondary | ICD-10-CM

## 2024-05-12 DIAGNOSIS — M25852 Other specified joint disorders, left hip: Secondary | ICD-10-CM | POA: Diagnosis not present

## 2024-05-12 DIAGNOSIS — M1612 Unilateral primary osteoarthritis, left hip: Secondary | ICD-10-CM | POA: Diagnosis not present

## 2024-05-12 MED ORDER — METHYLPREDNISOLONE ACETATE 40 MG/ML IJ SUSP
40.0000 mg | Freq: Once | INTRAMUSCULAR | Status: AC
  Administered 2024-05-12: 40 mg via INTRA_ARTICULAR

## 2024-05-12 NOTE — Progress Notes (Signed)
" ° °  Subjective:    Patient ID: Pamela Hansen, female    DOB: 64 y.o., 12/02/1960   MRN: [id number]  Chief Complaint: Left hip injection  History of Present Illness Patient presents today specifically for left hip intra-articular corticosteroid injection     Objective:   Vitals:   05/12/24 0831  BP: 100/72   Intra-articular Hip Injection with Ultrasound Guidance Procedure Note MARYLOUISE MALLET 1960-09-28 Indications: Pain Procedure Details Following the description of risks including infection, bleeding, damage to surrounding structures, patient provided written consent for left hip joint injection procedure. US  was used to identify the femoral head-neck junction and doppler ultrasound was used to identify the nearby vasculature. Patient was sterilely prepped in the usual fashion with alcohol.  Following topical anesthetization with ethyl chloride they were injected with a solution of 40mg  Depo-medrol  and 4cc Mepivacaine 2% via the anterior approach into the femoroacetabular joint capsule. This was well visualized under ultrasound, please see associated photographic documentation. Patient tolerated well without complication.  Precautions provided. Cleaned and dressing applied.   Assessment & Plan:   Assessment & Plan [name] tolerated the procedure well.  She has an upcoming trip to [place] and will be doing significantly more walking than usual at that time.  Recommend follow-up in 4-6 weeks to reassess.  Provided handout for hip conditioning home exercise program. If pain worsens/persists in the interim, consider MRI.   "

## 2024-05-12 NOTE — Addendum Note (Signed)
 Addended by: MARCINE HARLENE SAILOR on: 05/12/2024 09:11 AM   Modules accepted: Orders

## 2024-05-18 ENCOUNTER — Ambulatory Visit: Payer: Self-pay | Admitting: Family Medicine

## 2024-07-08 ENCOUNTER — Encounter: Payer: 59 | Admitting: Internal Medicine
